# Patient Record
Sex: Male | Born: 1960 | Race: White | Hispanic: No | Marital: Married | Smoking: Never smoker
Health system: Southern US, Community
[De-identification: ages and names within clinical notes are randomized; demographics above are authoritative.]

## PROBLEM LIST (undated history)

## (undated) DIAGNOSIS — Z86711 Personal history of pulmonary embolism: Secondary | ICD-10-CM

## (undated) HISTORY — PX: HERNIA REPAIR: SHX51

## (undated) HISTORY — PX: HAND SURGERY: SHX662

---

## 2013-04-12 ENCOUNTER — Encounter (HOSPITAL_COMMUNITY): Payer: Self-pay | Admitting: Emergency Medicine

## 2013-04-12 ENCOUNTER — Emergency Department (HOSPITAL_COMMUNITY)
Admission: EM | Admit: 2013-04-12 | Discharge: 2013-04-12 | Disposition: A | Discharge status: Home / Self Care | Payer: Managed Care, Other (non HMO) | Attending: Emergency Medicine | Admitting: Emergency Medicine

## 2013-04-12 DIAGNOSIS — H538 Other visual disturbances: Secondary | ICD-10-CM

## 2013-04-12 DIAGNOSIS — Z79899 Other long term (current) drug therapy: Secondary | ICD-10-CM | POA: Insufficient documentation

## 2013-04-12 DIAGNOSIS — R062 Wheezing: Secondary | ICD-10-CM | POA: Insufficient documentation

## 2013-04-12 MED ORDER — TETRACAINE HCL 0.5 % OP SOLN
1.0000 [drp] | Freq: Once | OPHTHALMIC | Status: AC
  Administered 2013-04-12: 1 [drp] via OPHTHALMIC

## 2013-04-12 MED ORDER — FLUORESCEIN SODIUM 1 MG OP STRP
1.0000 | ORAL_STRIP | Freq: Once | OPHTHALMIC | Status: AC
  Administered 2013-04-12: 1 via OPHTHALMIC
  Filled 2013-04-12: qty 1

## 2013-04-12 NOTE — Discharge Instructions (Signed)
Please follow-up with Dr. Gwen Pounds tomorrow regarding blurry vision and floaters to eye. Please refrain from straining eye too much. Please monitor symptoms and if symptoms worsen (inflammation, pain, swelling, fever, chills) please report back to the ED.    Blurred Vision You have been seen today complaining of blurred vision. This means you have a loss of ability to see small details.  CAUSES  Blurred vision can be a symptom of underlying eye problems, such as:  Aging of the eye (presbyopia).  Glaucoma.  Cataracts.  Eye infection.  Eye-related migraine.  Diabetes mellitus.  Fatigue.  Migraine headaches.  High blood pressure.  Breakdown of the back of the eye (macular degeneration).  Problems caused by some medications. The most common cause of blurred vision is the need for eyeglasses or a new prescription. Today in the emergency department, no cause for your blurred vision can be found. SYMPTOMS  Blurred vision is the loss of visual sharpness and detail (acuity). DIAGNOSIS  Should blurred vision continue, you should see your caregiver. If your caregiver is your primary care physician, he or she may choose to refer you to another specialist.  TREATMENT  Do not ignore your blurred vision. Make sure to have it checked out to see if further treatment or referral is necessary. SEEK MEDICAL CARE IF:  You are unable to get into a specialist so we can help you with a referral. SEEK IMMEDIATE MEDICAL CARE IF: You have severe eye pain, severe headache, or sudden loss of vision. MAKE SURE YOU:   Understand these instructions.  Will watch your condition.  Will get help right away if you are not doing well or get worse. Document Released: 12/13/2003 Document Revised: 03/03/2012 Document Reviewed: 07/14/2008 [facility] Patient Information 2013 [place], [place].

## 2013-04-12 NOTE — ED Provider Notes (Signed)
History    This chart was scribed for non-physician practitioner Raymon Mutton working with Ward Givens, MD by Quintella Reichert, ED Scribe. This patient was seen in room TR04C/TR04C and the patient's care was started at 7:01 PM .   CSN: [id number]  Arrival date & time 04/12/13  1759       Chief Complaint  Patient presents with  . Blurred Vision     The history is provided by the patient. No language interpreter was used.   Alec Sharp is a 52 y.o. male who presents to the Emergency Department complaining of constant, sudden-onset visual disturbance in his right eye.  Pt states he woke up this morning with a large "floater" in the lower right quadrant of his eye, and noticed visual distortion when light hit the eye   He describes spots as "black snow," and also reports cloudiness in that eye.  Pt denies eye pain, eye discharge, ear pain, facial swelling, facial pain, cough, rhinorrhea, congestion, fever, chills, CP, or any other associated symptoms.  He denies prior h/o eye issues, but reports that his father had a torn retina 20 years ago.  Pt wears prescription eye-glasses.   History reviewed. No pertinent past medical history.  History reviewed. No pertinent past surgical history.  History reviewed. No pertinent family history.  History  Substance Use Topics  . Smoking status: Never Smoker   . Smokeless tobacco: Not on file  . Alcohol Use: Yes     Comment: occ      Review of Systems  Constitutional: Negative for fever and chills.  HENT: Negative for congestion, facial swelling and rhinorrhea.   Eyes: Positive for visual disturbance. Negative for pain and discharge.  Respiratory: Negative for cough.   Cardiovascular: Negative for chest pain.  All other systems reviewed and are negative.    Allergies  Benzoyl peroxide  Home Medications   Current Outpatient Rx  Name  Route  Sig  Dispense  Refill  . Cyanocobalamin (VITAMIN B-12) 500 MCG SUBL   Sublingual  Place 500 mcg under the tongue daily.         Marland Kitchen terbinafine (LAMISIL) 250 MG tablet   Oral   Take 250 mg by mouth daily.           BP 142/94  Pulse 69  Temp(Src) 98.3 F (36.8 C) (Oral)  Resp 16  SpO2 98%  Physical Exam  Nursing note and vitals reviewed. Constitutional: He is oriented to person, place, and time. He appears well-developed and well-nourished. No distress.  HENT:  Head: Normocephalic and atraumatic.  Mouth/Throat: Oropharynx is clear and moist. No oropharyngeal exudate.  Eyes: Pupils are equal, round, and reactive to light.  PERRL positive both eyes. Able to identify numbers when held up using right eye. Reports cloudy vision.   Neck: Normal range of motion. Neck supple. No tracheal deviation present.  Cardiovascular: Normal rate, regular rhythm and normal heart sounds.   No murmur heard. Pulmonary/Chest: Effort normal. No respiratory distress. He has wheezes. He has no rales.  Musculoskeletal: Normal range of motion. He exhibits no tenderness.  Lymphadenopathy:    He has no cervical adenopathy.  Neurological: He is alert and oriented to person, place, and time. He exhibits normal muscle tone. Coordination normal.  Skin: Skin is warm and dry. No rash noted. No erythema.  Psychiatric: He has a normal mood and affect. His behavior is normal. Thought content normal.    ED Course  Procedures (including critical care time)  DIAGNOSTIC STUDIES: Oxygen Saturation is 99% on room air, adequate by my interpretation.    COORDINATION OF CARE: 7:22 PM-Discussed treatment plan which includes slit-lamp exam with pt at bedside and pt agreed to plan.   8:15 PM: Performed SLIT LAMP EXAM: Negative Siedel's sign. No dendritic lesion noted. No corneal abrasion noted.   Labs Reviewed - No data to display No results found.   1. Blurry vision       MDM  Patient afebrile, normotensive, non-tachycardic alert and oriented. Visual acuity exam performed - without  glasses bilateral 20/70, right 20/200, left 20/70 - with glasses bilaterally 20/20, right 20/25, left 20/20.Slit lamp negative findings - negative Seidel's sign, negative dendritic lesion. Negative foreign bodies. Consulted with Dr. Gwen Pounds at 8:12 pm - discussed case with Dr. Gwen Pounds stated to discharge patient and he will see patient in office tomorrow (Monday, 04/13/2013), stated that nothing needed to be given for treatment for discharge - possible retinal detachment. Patient aseptic, non-toxic appearing, in no acute distress. Discharged patient - discussed to follow-up with Dr. Gwen Pounds. Discussed with patient - patient agreed to plan of care, understood, all questions answered.    I personally performed the services described in this documentation, which was scribed in my presence. The recorded information has been reviewed and is accurate.    Raymon Mutton, PA-C 04/13/13 0036  Raymon Mutton, PA-C 04/13/13 1605

## 2013-04-12 NOTE — ED Notes (Signed)
Patient states his father had the same symptoms when he was his age and it was a detatched retina.

## 2013-04-12 NOTE — ED Notes (Signed)
Pt sent here for possible detached retina; pt sts blurry vision with black spots in vision field in right eye only

## 2013-04-15 NOTE — ED Provider Notes (Signed)
Medical screening examination/treatment/procedure(s) were performed by non-physician practitioner and as supervising physician I was immediately available for consultation/collaboration. Devoria Albe, MD, FACEP  Pt not discussed with me  Ward Givens, MD 04/15/13 1101

## 2016-11-08 ENCOUNTER — Emergency Department (HOSPITAL_COMMUNITY)
Admission: EM | Admit: 2016-11-08 | Discharge: 2016-11-08 | Disposition: A | Discharge status: Home / Self Care | Payer: Managed Care, Other (non HMO) | Attending: Emergency Medicine | Admitting: Emergency Medicine

## 2016-11-08 ENCOUNTER — Emergency Department (HOSPITAL_COMMUNITY): Payer: Managed Care, Other (non HMO)

## 2016-11-08 DIAGNOSIS — R0789 Other chest pain: Secondary | ICD-10-CM | POA: Diagnosis not present

## 2016-11-08 DIAGNOSIS — R072 Precordial pain: Secondary | ICD-10-CM | POA: Diagnosis present

## 2016-11-08 DIAGNOSIS — Z7982 Long term (current) use of aspirin: Secondary | ICD-10-CM | POA: Diagnosis not present

## 2016-11-08 LAB — I-STAT TROPONIN, ED
TROPONIN I, POC: 0 ng/mL (ref 0.00–0.08)
Troponin i, poc: 0.01 ng/mL (ref 0.00–0.08)

## 2016-11-08 LAB — BASIC METABOLIC PANEL
Anion gap: 5 (ref 5–15)
BUN: 12 mg/dL (ref 6–20)
CALCIUM: 8.9 mg/dL (ref 8.9–10.3)
CO2: 24 mmol/L (ref 22–32)
CREATININE: 0.93 mg/dL (ref 0.61–1.24)
Chloride: 110 mmol/L (ref 101–111)
GFR calc Af Amer: >60 mL/min (ref >60)
GLUCOSE: 120 mg/dL — AB (ref 65–99)
Potassium: 4.3 mmol/L (ref 3.5–5.1)
Sodium: 139 mmol/L (ref 135–145)

## 2016-11-08 LAB — D-DIMER, QUANTITATIVE: D-Dimer, Quant: <0.27 ug/mL-FEU (ref 0.00–0.50)

## 2016-11-08 LAB — CBC
HCT: 40 % (ref 39.0–52.0)
HEMOGLOBIN: 14 g/dL (ref 13.0–17.0)
MCH: 33.4 pg (ref 26.0–34.0)
MCHC: 35 g/dL (ref 30.0–36.0)
MCV: 95.5 fL (ref 78.0–100.0)
PLATELETS: 144 10*3/uL — AB (ref 150–400)
RBC: 4.19 MIL/uL — ABNORMAL LOW (ref 4.22–5.81)
RDW: 12.7 % (ref 11.5–15.5)
WBC: 5.4 10*3/uL (ref 4.0–10.5)

## 2016-11-08 MED ORDER — GI COCKTAIL ~~LOC~~
30.0000 mL | Freq: Once | ORAL | Status: AC
  Administered 2016-11-08: 30 mL via ORAL
  Filled 2016-11-08: qty 30

## 2016-11-08 NOTE — ED Provider Notes (Signed)
[facility]-EMERGENCY DEPT Provider Note   CSN: [id number] Arrival date & time: 11/08/16  1209     History   Chief Complaint Chief Complaint  Patient presents with  . Chest Pain    HPI Alec Sharp is a 55 y.o. male.  The history is provided by the patient.  Chest Pain   This is a new problem. The current episode started yesterday. The problem occurs constantly. The problem has not changed since onset.The pain is present in the substernal region. The pain is at a severity of 1/10. The quality of the pain is described as dull. The pain does not radiate. Associated symptoms include lower extremity edema (last night after riding bike, but now resolved). Pertinent negatives include no abdominal pain, no cough, no diaphoresis, no exertional chest pressure, no fever, no irregular heartbeat, no malaise/fatigue, no nausea and no shortness of breath.  Pertinent negatives for past medical history include no CAD, no COPD, no CHF, no diabetes, no hyperlipidemia, no hypertension, no MI and no PE.  Procedure history is negative for cardiac catheterization and exercise treadmill test.   Pain started 4-54 hours after bike riding.   No past medical history on file.  There are no active problems to display for this patient.   No past surgical history on file.     Home Medications    Prior to Admission medications   Medication Sig Start Date End Date Taking? Authorizing Provider  aspirin EC 81 MG tablet Take 81 mg by mouth daily.   Yes Historical Provider, MD  Cyanocobalamin (VITAMIN B-12) 1000 MCG SUBL Place 1,000 mcg under the tongue 3 (three) times a week.    Yes Historical Provider, MD    Family History No family history on file.  Social History Social History  Substance Use Topics  . Smoking status: Never Smoker  . Smokeless tobacco: Not on file  . Alcohol use Yes     Comment: occ     Allergies   Benzoyl peroxide   Review of Systems Review of Systems  Constitutional:  Negative for diaphoresis, fever and malaise/fatigue.  Respiratory: Negative for cough and shortness of breath.   Cardiovascular: Positive for chest pain.  Gastrointestinal: Negative for abdominal pain and nausea.  Ten systems are reviewed and are negative for acute change except as noted in the HPI    Physical Exam Updated Vital Signs BP 114/74   Pulse (!) 54   Resp 17   SpO2 100%   Physical Exam  Constitutional: He is oriented to person, place, and time. He appears well-developed and well-nourished. No distress.  HENT:  Head: Normocephalic and atraumatic.  Nose: Nose normal.  Eyes: Conjunctivae and EOM are normal. Pupils are equal, round, and reactive to light. Right eye exhibits no discharge. Left eye exhibits no discharge. No scleral icterus.  Neck: Normal range of motion. Neck supple.  Cardiovascular: Normal rate and regular rhythm.  Exam reveals no gallop and no friction rub.   No murmur heard. Pulmonary/Chest: Effort normal and breath sounds normal. No stridor. No respiratory distress. He has no rales.  Abdominal: Soft. He exhibits no distension. There is no tenderness.  Musculoskeletal: He exhibits no edema or tenderness.  Neurological: He is alert and oriented to person, place, and time.  Skin: Skin is warm and dry. No rash noted. He is not diaphoretic. No erythema.  Psychiatric: He has a normal mood and affect.  Vitals reviewed.    ED Treatments / Results  Labs (all labs ordered are  listed, but only abnormal results are displayed) Labs Reviewed  BASIC METABOLIC PANEL - Abnormal; Notable for the following:       Result Value   Glucose, Bld 120 (*)    All other components within normal limits  CBC - Abnormal; Notable for the following:    RBC 4.19 (*)    Platelets 144 (*)    All other components within normal limits  D-DIMER, QUANTITATIVE (NOT AT [facility])  Rosezena SensorI-STAT TROPOININ, ED  I-STAT TROPOININ, ED    EKG  EKG Interpretation  Date/Time:  Thursday November 08 2016 12:14:37 EST Ventricular Rate:  62 PR Interval:    QRS Duration: 94 QT Interval:  410 QTC Calculation: 417 R Axis:   72 Text Interpretation:  Sinus rhythm Abnormal inferior Q waves No old tracing to compare Confirmed by [facility] MD, PEDRO [phone]([id number]) on 11/08/2016 12:58:13 PM       Radiology Dg Chest 2 View  Result Date: 11/08/2016 CLINICAL DATA:  Chest pain for 1 day EXAM: CHEST  2 VIEW COMPARISON:  None. FINDINGS: There is mild atelectatic change in the right base. The lungs elsewhere are clear. Heart size and pulmonary vascularity are normal. No adenopathy. There is atherosclerotic calcification in the aortic arch. No pneumothorax. No bone lesions. IMPRESSION: Aortic atherosclerosis. Atelectatic change right base. No edema or consolidation. Electronically Signed   By: Bretta BangWilliam  Woodruff III M.D.   On: 11/08/2016 13:18    Procedures Procedures (including critical care time)  Medications Ordered in ED Medications  gi cocktail (Maalox,Lidocaine,Donnatal) (30 mLs Oral Given 11/08/16 1255)     Initial Impression / Assessment and Plan / ED Course  I have reviewed the triage vital signs and the nursing notes.  Pertinent labs & imaging results that were available during my care of the patient were reviewed by me and considered in my medical decision making (see chart for details).  Clinical Course     Atypical chest pain ongoing for more than 12 hours. EKG with inferior Q waves however no evidence of acute ischemic changes or evidence of pericarditis. Negative chest x-ray for chest pain. HEAR < 3. 3 hour serial troponins negative 2. Feel this is sufficient to rule out ACS given the atypical and non-cardiac nature of the patient's chest pain. [facility] due to [facility]. Dimer negative. Low suspicion for pulmonary embolism. Presentation is classic for aortic dissection or esophageal perforation. Patient provided with GI cocktail. On reassessment patient's chest pain had completely  resolved. Possible GI etiology for patient's presentation.  Safe for discharge with strict return precautions.   Final Clinical Impressions(s) / ED Diagnoses   Final diagnoses:  Atypical chest pain   Disposition: Discharge  Condition: Good  I have discussed the results, Dx and Tx plan with the patient who expressed understanding and agree(s) with the plan. Discharge instructions discussed at great length. The patient was given strict return precautions who verbalized understanding of the instructions. No further questions at time of discharge.    Discharge Medication List as of 11/08/2016  4:06 PM      [facility] [address] [place] [id number] [phone] Call  For help establishing care with a care provider      Nira ConnPedro Eduardo Cardama, MD 11/08/16 [phone]

## 2016-11-08 NOTE — ED Notes (Signed)
Dr. Eudelia Bunchardama notified patient with 2 episodes of nonsustained vtach captured on monitor at 1225. Pt denies pain. VSS. Pt awake, alert, NAD at present. NNO at this time, will continue to monitor.

## 2016-11-08 NOTE — ED Triage Notes (Signed)
[facility] where pt was seen for chest pain that began last night after exercise. Pt reports being vegan on sodium restricted diet xlast 5weeks. Reports swollen legs, racing pounding in chest that lasted longer than normal and unable to sleep last night due to palpitations that never went away. Pt with 20g IV LAC, 324mg  ASA, 1 nitro with no change in pain. Rating pain 1/10 currently with deep breath. Denies recent fever/cough. VSS.

## 2016-11-14 ENCOUNTER — Other Ambulatory Visit (HOSPITAL_COMMUNITY): Payer: Self-pay | Admitting: Radiology

## 2016-11-14 ENCOUNTER — Encounter (HOSPITAL_COMMUNITY): Payer: Self-pay

## 2016-11-14 DIAGNOSIS — R001 Bradycardia, unspecified: Secondary | ICD-10-CM | POA: Diagnosis not present

## 2016-11-14 DIAGNOSIS — I824Z2 Acute embolism and thrombosis of unspecified deep veins of left distal lower extremity: Secondary | ICD-10-CM | POA: Diagnosis not present

## 2016-11-14 DIAGNOSIS — Z79899 Other long term (current) drug therapy: Secondary | ICD-10-CM | POA: Insufficient documentation

## 2016-11-14 DIAGNOSIS — I2699 Other pulmonary embolism without acute cor pulmonale: Secondary | ICD-10-CM | POA: Diagnosis not present

## 2016-11-14 DIAGNOSIS — Z8249 Family history of ischemic heart disease and other diseases of the circulatory system: Secondary | ICD-10-CM | POA: Insufficient documentation

## 2016-11-14 DIAGNOSIS — Z7982 Long term (current) use of aspirin: Secondary | ICD-10-CM | POA: Diagnosis not present

## 2016-11-14 DIAGNOSIS — R079 Chest pain, unspecified: Secondary | ICD-10-CM | POA: Diagnosis present

## 2016-11-14 NOTE — ED Triage Notes (Signed)
Pt states that central CP started this evening with radiation to neck and SOB, denies n/v.

## 2016-11-15 ENCOUNTER — Observation Stay (HOSPITAL_BASED_OUTPATIENT_CLINIC_OR_DEPARTMENT_OTHER): Payer: Managed Care, Other (non HMO)

## 2016-11-15 ENCOUNTER — Observation Stay (HOSPITAL_COMMUNITY)
Admission: EM | Admit: 2016-11-15 | Discharge: 2016-11-16 | Disposition: A | Discharge status: Home / Self Care | Payer: Managed Care, Other (non HMO) | Attending: Internal Medicine | Admitting: Internal Medicine

## 2016-11-15 ENCOUNTER — Emergency Department (HOSPITAL_COMMUNITY): Payer: Managed Care, Other (non HMO)

## 2016-11-15 DIAGNOSIS — R001 Bradycardia, unspecified: Secondary | ICD-10-CM | POA: Diagnosis not present

## 2016-11-15 DIAGNOSIS — R079 Chest pain, unspecified: Secondary | ICD-10-CM | POA: Diagnosis present

## 2016-11-15 DIAGNOSIS — I2699 Other pulmonary embolism without acute cor pulmonale: Secondary | ICD-10-CM | POA: Diagnosis present

## 2016-11-15 LAB — HEPATIC FUNCTION PANEL
ALK PHOS: 69 U/L (ref 38–126)
ALT: 15 U/L — AB (ref 17–63)
AST: 24 U/L (ref 15–41)
Albumin: 4.6 g/dL (ref 3.5–5.0)
BILIRUBIN DIRECT: 0.2 mg/dL (ref 0.1–0.5)
BILIRUBIN INDIRECT: 0.7 mg/dL (ref 0.3–0.9)
BILIRUBIN TOTAL: 0.9 mg/dL (ref 0.3–1.2)
Total Protein: 7.2 g/dL (ref 6.5–8.1)

## 2016-11-15 LAB — BASIC METABOLIC PANEL
Anion gap: 9 (ref 5–15)
BUN: 13 mg/dL (ref 6–20)
CALCIUM: 9.4 mg/dL (ref 8.9–10.3)
CO2: 24 mmol/L (ref 22–32)
CREATININE: 1.03 mg/dL (ref 0.61–1.24)
Chloride: 106 mmol/L (ref 101–111)
GFR calc Af Amer: >60 mL/min (ref >60)
GFR calc non Af Amer: >60 mL/min (ref >60)
GLUCOSE: 113 mg/dL — AB (ref 65–99)
Potassium: 3.6 mmol/L (ref 3.5–5.1)
Sodium: 139 mmol/L (ref 135–145)

## 2016-11-15 LAB — CBC
HEMATOCRIT: 42.5 % (ref 39.0–52.0)
HEMOGLOBIN: 15.3 g/dL (ref 13.0–17.0)
MCH: 33.9 pg (ref 26.0–34.0)
MCHC: 36 g/dL (ref 30.0–36.0)
MCV: 94.2 fL (ref 78.0–100.0)
Platelets: 158 10*3/uL (ref 150–400)
RBC: 4.51 MIL/uL (ref 4.22–5.81)
RDW: 12.2 % (ref 11.5–15.5)
WBC: 5.6 10*3/uL (ref 4.0–10.5)

## 2016-11-15 LAB — TROPONIN I: Troponin I: <0.03 ng/mL (ref <0.03)

## 2016-11-15 LAB — I-STAT TROPONIN, ED: Troponin i, poc: 0 ng/mL (ref 0.00–0.08)

## 2016-11-15 LAB — HEPARIN LEVEL (UNFRACTIONATED)
HEPARIN UNFRACTIONATED: 0.49 [IU]/mL (ref 0.30–0.70)
Heparin Unfractionated: 0.5 IU/mL (ref 0.30–0.70)

## 2016-11-15 LAB — LIPASE, BLOOD: Lipase: 31 U/L (ref 11–51)

## 2016-11-15 LAB — BRAIN NATRIURETIC PEPTIDE: B Natriuretic Peptide: 62.5 pg/mL (ref 0.0–100.0)

## 2016-11-15 LAB — D-DIMER, QUANTITATIVE: D-Dimer, Quant: 0.57 ug/mL-FEU — ABNORMAL HIGH (ref 0.00–0.50)

## 2016-11-15 MED ORDER — PANTOPRAZOLE SODIUM 40 MG PO TBEC
40.0000 mg | DELAYED_RELEASE_TABLET | Freq: Every day | ORAL | Status: DC
  Administered 2016-11-15 – 2016-11-16 (×2): 40 mg via ORAL
  Filled 2016-11-15 (×2): qty 1

## 2016-11-15 MED ORDER — GI COCKTAIL ~~LOC~~
30.0000 mL | Freq: Once | ORAL | Status: DC
  Filled 2016-11-15: qty 30

## 2016-11-15 MED ORDER — ASPIRIN 81 MG PO CHEW: 324.0000 mg | CHEWABLE_TABLET | Freq: Once | ORAL | Status: DC

## 2016-11-15 MED ORDER — IOPAMIDOL (ISOVUE-370) INJECTION 76%
INTRAVENOUS | Status: AC
  Administered 2016-11-15: 100 mL
  Filled 2016-11-15: qty 100

## 2016-11-15 MED ORDER — HEPARIN (PORCINE) IN NACL 100-0.45 UNIT/ML-% IJ SOLN
1300.0000 [IU]/h | INTRAMUSCULAR | Status: DC
  Administered 2016-11-15 (×2): 1300 [IU]/h via INTRAVENOUS
  Filled 2016-11-15 (×2): qty 250

## 2016-11-15 MED ORDER — ONDANSETRON HCL 4 MG/2ML IJ SOLN: 4.0000 mg | Freq: Four times a day (QID) | INTRAMUSCULAR | Status: DC | PRN

## 2016-11-15 MED ORDER — ASPIRIN EC 81 MG PO TBEC
81.0000 mg | DELAYED_RELEASE_TABLET | Freq: Every day | ORAL | Status: DC
  Administered 2016-11-15: 81 mg via ORAL
  Filled 2016-11-15 (×2): qty 1

## 2016-11-15 MED ORDER — NITROGLYCERIN 0.4 MG SL SUBL: 0.4000 mg | SUBLINGUAL_TABLET | SUBLINGUAL | Status: DC | PRN

## 2016-11-15 MED ORDER — ONDANSETRON HCL 4 MG PO TABS: 4.0000 mg | ORAL_TABLET | Freq: Four times a day (QID) | ORAL | Status: DC | PRN

## 2016-11-15 MED ORDER — TRAMADOL HCL 50 MG PO TABS: 50.0000 mg | ORAL_TABLET | Freq: Four times a day (QID) | ORAL | Status: DC | PRN

## 2016-11-15 MED ORDER — HEPARIN BOLUS VIA INFUSION
5000.0000 [IU] | Freq: Once | INTRAVENOUS | Status: AC
  Administered 2016-11-15: 5000 [IU] via INTRAVENOUS
  Filled 2016-11-15: qty 5000

## 2016-11-15 NOTE — Progress Notes (Signed)
ANTICOAGULATION CONSULT NOTE - Follow Up Consult  Pharmacy Consult:  Heparin Indication:  New PE  Allergies  Allergen Reactions  . Benzoyl Peroxide Rash and Other (See Comments)    Cream    Patient Measurements: Height: 5\' 10"  (177.8 cm) Weight: 197 lb 4.8 oz (89.5 kg) IBW/kg (Calculated) : 73 Heparin Dosing Weight: 90 kg  Vital Signs: Temp: 97.7 F (36.5 C) (11/23 1114) Temp Source: Oral (11/23 1114) BP: 134/92 (11/23 1114) Pulse Rate: 53 (11/23 0550)  Labs:  Recent Labs  11/14/16 2342 11/15/16 0505 11/15/16 1012  HGB 15.3  --   --   HCT 42.5  --   --   PLT 158  --   --   HEPARINUNFRC  --   --  0.49  CREATININE 1.03  --   --   TROPONINI  --  <0.03  --     Estimated Creatinine Clearance: 91.2 mL/min (by C-G formula based on SCr of 1.03 mg/dL).     Assessment: [age] YOM with no significant medical history admitted with new PE.  Patient continues on IV heparin and heparin level is therapeutic.  No bleeding reported.   Goal of Therapy:  Heparin level 0.3-0.7 units/ml Monitor platelets by anticoagulation protocol: Yes    Plan:  - Continue heparin gtt at 1300 units/hr - Check confirmatory heparin level - Daily heparin level and CBC - F/U with transitioning [facility]   Taneasha Fuqua D. Laney Potashang, PharmD, BCPS Pager:  [phone] 11/15/2016, 11:28 AM

## 2016-11-15 NOTE — ED Notes (Signed)
Pt refused GI cocktail. Said he had last week and it had no affect.

## 2016-11-15 NOTE — ED Notes (Signed)
Pt back from CT

## 2016-11-15 NOTE — ED Notes (Signed)
EDP at bedside  

## 2016-11-15 NOTE — ED Notes (Signed)
Patient transported to CT 

## 2016-11-15 NOTE — ED Notes (Signed)
Report called and given to RN on 2W

## 2016-11-15 NOTE — Progress Notes (Signed)
ANTICOAGULATION CONSULT NOTE - Follow Up Consult  Pharmacy Consult:  Heparin Indication:  New PE  Allergies  Allergen Reactions  . Benzoyl Peroxide Rash and Other (See Comments)    Cream    Patient Measurements: Height: 5\' 10"  (177.8 cm) Weight: 197 lb 4.8 oz (89.5 kg) IBW/kg (Calculated) : 73 Heparin Dosing Weight: 90 kg  Vital Signs: Temp: 97.9 F (36.6 C) (11/23 1351) Temp Source: Oral (11/23 1351) BP: 136/85 (11/23 1351) Pulse Rate: 58 (11/23 1351)  Labs:  Recent Labs  11/14/16 2342 11/15/16 0505 11/15/16 1012 11/15/16 1540 11/15/16 1541  HGB 15.3  --   --   --   --   HCT 42.5  --   --   --   --   PLT 158  --   --   --   --   HEPARINUNFRC  --   --  0.49  --  0.50  CREATININE 1.03  --   --   --   --   TROPONINI  --  <0.03 <0.03 <0.03  --     Estimated Creatinine Clearance: 91.2 mL/min (by C-G formula based on SCr of 1.03 mg/dL).   Medications: Heparin @ 1300 units/hr  Assessment: 55yom continues on heparin for a new PE. Confirmatory heparin level is therapeutic at 0.50. No bleeding reported.  Goal of Therapy:  Heparin level 0.3-0.7 units/ml Monitor platelets by anticoagulation protocol: Yes   Plan:  1) Continue heparin at 1300 units/hr 2) Daily heparin level and CBC   Louie CasaJennifer Odeth Bry, PharmD, BCPS 11/15/2016, 4:46 PM

## 2016-11-15 NOTE — ED Provider Notes (Signed)
[facility]-EMERGENCY DEPT Provider Note   CSN: [id number] Arrival date & time: 11/14/16  2330  By signing my name below, I, Clovis PuAvnee Patel, attest that this documentation has been prepared under the direction and in the presence of Glynn OctaveStephen Wm Fruchter, MD  Electronically Signed: Clovis PuAvnee Patel, ED Scribe. 11/15/16. 1:54 AM.  History   Chief Complaint Chief Complaint  Patient presents with  . Chest Pain   The history is provided by the patient. No language interpreter was used.   HPI Comments:  Alec Sharp is a 55 y.o. male who presents to the Emergency Department complaining of intermittent, recurring diffuse "1/10 dull" chest pain and chest tightness x 1 week which worsened today. He notes his pain lasts for 15-20 minutes. Pt states the pain radiates to his neck and is worse with breathing. He notes associated nausea. Pt states his "heart has not been feeling right" intermittently during the week.  He has taken 4-81 mg baby aspirin and 2 nitroglycerin treatments, at PCP office, with relief. Pt denies hx of heart problems, hx of asthma, hx of COPD, hx of ulcers, cough, back pain, current neck pain, fevers, light-headedness, any other associated symptoms and modifying factors at this time.Pt was here on 11/08/16 with similar symptoms which began after using a stationary bike. Pt states he has never had stress test or catheterization. He is an occasional alcohol user. Pt is a non-smoker. He notes a family hx of heart problems.   History reviewed. No pertinent past medical history.  There are no active problems to display for this patient.   History reviewed. No pertinent surgical history.   Home Medications    Prior to Admission medications   Medication Sig Start Date End Date Taking? Authorizing Provider  aspirin EC 81 MG tablet Take 81 mg by mouth daily.    Historical Provider, MD  Cyanocobalamin (VITAMIN B-12) 1000 MCG SUBL Place 1,000 mcg under the tongue 3 (three) times a week.      Historical Provider, MD    Family History No family history on file.  Social History Social History  Substance Use Topics  . Smoking status: Never Smoker  . Smokeless tobacco: Never Used  . Alcohol use Yes     Comment: occ     Allergies   Benzoyl peroxide   Review of Systems Review of Systems  Constitutional: Negative for fever.  Respiratory: Positive for chest tightness. Negative for cough.   Cardiovascular: Positive for chest pain.  Gastrointestinal: Positive for nausea.  Musculoskeletal: Negative for back pain and neck pain.  Neurological: Negative for light-headedness.  10 systems reviewed and all are negative for acute change except as noted in the HPI.  Physical Exam Updated Vital Signs BP (!) 146/109   Pulse 64   Temp 98.2 F (36.8 C)   Resp 20   Ht 5\' 10"  (1.778 m)   Wt 194 lb (88 kg)   SpO2 98%   BMI 27.84 kg/m   Physical Exam  Constitutional: He is oriented to person, place, and time. He appears well-developed and well-nourished. No distress.  HENT:  Head: Normocephalic and atraumatic.  Mouth/Throat: Oropharynx is clear and moist. No oropharyngeal exudate.  Eyes: Conjunctivae and EOM are normal. Pupils are equal, round, and reactive to light.  Neck: Normal range of motion. Neck supple.  No meningismus.  Cardiovascular: Normal rate, regular rhythm, normal heart sounds and intact distal pulses.   No murmur heard. Equal peripheral pulses.   Pulmonary/Chest: Effort normal and breath sounds normal. No  respiratory distress.  Abdominal: Soft. There is no tenderness. There is no rebound and no guarding.  Musculoskeletal: Normal range of motion. He exhibits no edema or tenderness.  Neurological: He is alert and oriented to person, place, and time. No cranial nerve deficit. He exhibits normal muscle tone. Coordination normal.   5/5 strength throughout. CN 2-12 intact.Equal grip strength.   Skin: Skin is warm.  Psychiatric: He has a normal mood and affect.  His behavior is normal.  Nursing note and vitals reviewed.    ED Treatments / Results  DIAGNOSTIC STUDIES:  Oxygen Saturation is 98% on RA, normal by my interpretation.    COORDINATION OF CARE:  1:47 AM Discussed treatment plan with pt at bedside and pt agreed to plan.  Labs (all labs ordered are listed, but only abnormal results are displayed) Labs Reviewed  BASIC METABOLIC PANEL - Abnormal; Notable for the following:       Result Value   Glucose, Bld 113 (*)    All other components within normal limits  HEPATIC FUNCTION PANEL - Abnormal; Notable for the following:    ALT 15 (*)    All other components within normal limits  D-DIMER, QUANTITATIVE (NOT AT [facility]) - Abnormal; Notable for the following:    D-Dimer, Quant 0.57 (*)    All other components within normal limits  CBC  LIPASE, BLOOD  BRAIN NATRIURETIC PEPTIDE  TROPONIN I  HEPARIN LEVEL (UNFRACTIONATED)  TROPONIN I  TROPONIN I  I-STAT TROPOININ, ED    EKG  EKG Interpretation  Date/Time:  Thursday November 15 2016 01:36:23 EST Ventricular Rate:  52 PR Interval:    QRS Duration: 96 QT Interval:  428 QTC Calculation: 398 R Axis:   78 Text Interpretation:  Sinus rhythm Inferior Q waves noted No significant change was found Confirmed by Manus Gunning  MD, Cherrish Vitali [phone]) on 11/15/2016 1:49:45 AM       Radiology Dg Chest 2 View  Result Date: 11/15/2016 CLINICAL DATA:  Acute onset of generalized chest pain and weakness. Initial encounter. EXAM: CHEST  2 VIEW COMPARISON:  Chest radiograph performed 11/08/2016 FINDINGS: The lungs are well-aerated. Vascular congestion is noted. Bibasilar atelectasis is noted. No pleural effusion or pneumothorax is seen. The heart is normal in size; the mediastinal contour is within normal limits. No acute osseous abnormalities are seen. IMPRESSION: Vascular congestion noted.  Bibasilar atelectasis seen. Electronically Signed   By: Roanna Raider M.D.   On: 11/15/2016 00:38   Ct Angio  Chest Pe W And/or Wo Contrast  Result Date: 11/15/2016 CLINICAL DATA:  55 y/o  M; chest pain and shortness of breath. EXAM: CT ANGIOGRAPHY CHEST WITH CONTRAST TECHNIQUE: Multidetector CT imaging of the chest was performed using the standard protocol during bolus administration of intravenous contrast. Multiplanar CT image reconstructions and MIPs were obtained to evaluate the vascular anatomy. CONTRAST:  100 cc Isovue 370 COMPARISON:  None. FINDINGS: Cardiovascular: Satisfactory opacification of the pulmonary arteries to the segmental level. Respiratory motion artifact at lung bases. Acute pulmonary embolus within segmental arteries of right upper lobe (series 406, image 74). No evidence for right heart strain. Mild cardiomegaly. No pericardial effusion. Aortic atherosclerosis with minimal arch calcification. Mediastinum/Nodes: No enlarged mediastinal, hilar, or axillary lymph nodes. Thyroid gland, trachea, and esophagus demonstrate no significant findings. Lungs/Pleura: Mild mosaic attenuation of lung parenchyma. Right lung apex 4 mm nodule (series 407, image 27). Minor bibasilar atelectasis. No acute process. No effusion or pneumothorax. Mild respiratory motion artifact of the lung bases. Upper Abdomen:  No acute abnormality. Musculoskeletal: No chest wall abnormality. No acute or significant osseous findings. Review of the MIP images confirms the above findings. IMPRESSION: 1. Acute pulmonary embolus within segmental arteries of right upper lobe. 2. Mild mosaic attenuation of lung parenchyma may represent small airways disease and air trapping or small vessel disease. 3. Mild cardiomegaly. These results were called by telephone at the time of interpretation on 11/15/2016 at 3:59 am to Dr. Glynn OctaveSTEPHEN Beaux [place] , who verbally acknowledged these results. Electronically Signed   By: Mitzi HansenLance  Furusawa-Stratton M.D.   On: 11/15/2016 04:00    Procedures Procedures (including critical care time)  Medications Ordered in  ED Medications - No data to display   Initial Impression / Assessment and Plan / ED Course  I have reviewed the triage vital signs and the nursing notes.  Pertinent labs & imaging results that were available during my care of the patient were reviewed by me and considered in my medical decision making (see chart for details).  Clinical Course    Patient with progressively worsening central chest pain ongoing for the past week. Similar visit for chest pain last week. EKG is unchanged. Troponin is negative. Pain is pleuritic.  No relief with aspirin and nitroglycerin. We'll discussed with cardiology. Patient has never had a stress test.  D/w Dr. Gala RomneyBensimhon who requests CT coronary which is not available at this time of night.  He recommends hospitalist admission.  D-dimer slightly elevated. CT does show segmental pulmonary embolism. We'll start heparin. Patient will be medically admitted. There is no hypoxia or increased work of breathing. There is no tachycardia.  D.w Dr. Katrinka BlazingSmith who will admit.  CRITICAL CARE Performed by: Glynn OctaveANCOUR, Dondra Rhett Total critical care time: 35 minutes Critical care time was exclusive of separately billable procedures and treating other patients. Critical care was necessary to treat or prevent imminent or life-threatening deterioration. Critical care was time spent personally by me on the following activities: development of treatment plan with patient and/or surrogate as well as nursing, discussions with consultants, evaluation of patient's response to treatment, examination of patient, obtaining history from patient or surrogate, ordering and performing treatments and interventions, ordering and review of laboratory studies, ordering and review of radiographic studies, pulse oximetry and re-evaluation of patient's condition.    Final Clinical Impressions(s) / ED Diagnoses   Final diagnoses:  Chest pain  Nonspecific chest pain  Other acute pulmonary embolism  without acute cor pulmonale (HCC)     New Prescriptions New Prescriptions   No medications on file  I personally performed the services described in this documentation, which was scribed in my presence. The recorded information has been reviewed and is accurate.     Glynn OctaveStephen Lynee Rosenbach, MD 11/15/16 [phone]

## 2016-11-15 NOTE — Progress Notes (Signed)
VASCULAR LAB PRELIMINARY  PRELIMINARY  PRELIMINARY  PRELIMINARY  Bilateral lower extremity venous duplex completed.    Preliminary report:  There is acute, partially occlusive DVT noted in the left peroneal vein.  All other veins appear thrombus free.   Stana Bayon, RVT 11/15/2016, 7:20 PM

## 2016-11-15 NOTE — Progress Notes (Signed)
PROGRESS NOTE    Alec Sharp  ZOX:[id number] DOB: 11-Feb-1961 DOA: 11/15/2016 PCP: Pcp Not In System    Brief Narrative:  55 yo male, presents with pleuritic chest pain, intermittent, for 7 days. Has been very sedentary over the last 3 mo. Positive family history for dvt. On physical examination found to be bradycardic. Positive d dimer, CT angiography with right upper lobe PE. Started on heparin, follow on [place] lower extremities and echocardiogram.    Assessment & Plan:   Principal Problem:   Pulmonary embolus (HCC) Active Problems:   Chest pain   Bradycardia   1. Provoked VTE pulmonary embolism. Will continue on IV heparin for anticoagulation, follow on echoardiogram and US dopplers of the lower extremities. Negative troponins. EKG with normal sinus rhythm.  2. Bradycardia. Sinus, possible increased vagal tone. Blood pressure systolic 120 to 130.     DVT prophylaxis: heparin Code Status: full  Family Communication: No family at the bedside Disposition Plan: home    Consultants:    Procedures:     Antimicrobials:     Subjective: Improve chest pain, worse with deep inspiration, no dyspnea, no palpitations, no nausea or vomiting.   Objective: Vitals:   11/15/16 0500 11/15/16 0549 11/15/16 0550 11/15/16 1114  BP: 143/92  122/82 (!) 134/92  Pulse: (!) 50  (!) 53   Resp: 10   18  Temp:   98.3 F (36.8 C) 97.7 F (36.5 C)  TempSrc:   Oral Oral  SpO2: 98%  100% 97%  Weight:  89.5 kg (197 lb 4.8 oz)    Height:  5\' 10"  (1.778 m)      Intake/Output Summary (Last 24 hours) at 11/15/16 1236 Last data filed at 11/15/16 0800  Gross per 24 hour  Intake              360 ml  Output                0 ml  Net              360 ml   Filed Weights   11/14/16 2341 11/15/16 0549  Weight: 88 kg (194 lb) 89.5 kg (197 lb 4.8 oz)    Examination:  General exam: not in pain or dyspnea E ENT: no pallor or icterus. Oral mucosa moist.   Respiratory system: Clear to  auscultation. Respiratory effort normal. No wheezing, rales or rhonchi.  Cardiovascular system: S1 & S2 heard, RRR. No JVD, murmurs, rubs, gallops or clicks. No pedal edema. Gastrointestinal system: Abdomen is nondistended, soft and nontender. No organomegaly or masses felt. Normal bowel sounds heard. Central nervous system: Alert and oriented. No focal neurological deficits. Extremities: Symmetric 5 x 5 power. Skin: No rashes, lesions or ulcers     Data Reviewed: I have personally reviewed following labs and imaging studies  CBC:  Recent Labs Lab 11/08/16 1330 11/14/16 2342  WBC 5.4 5.6  HGB 14.0 15.3  HCT 40.0 42.5  MCV 95.5 94.2  PLT 144* 158   Basic Metabolic Panel:  Recent Labs Lab 11/08/16 1330 11/14/16 2342  NA 139 139  K 4.3 3.6  CL 110 106  CO2 24 24  GLUCOSE 120* 113*  BUN 12 13  CREATININE 0.93 1.03  CALCIUM 8.9 9.4   GFR: Estimated Creatinine Clearance: 91.2 mL/min (by C-G formula based on SCr of 1.03 mg/dL). Liver Function Tests:  Recent Labs Lab 11/15/16 0144  AST 24  ALT 15*  ALKPHOS 69  BILITOT 0.9  PROT  7.2  ALBUMIN 4.6    Recent Labs Lab 11/15/16 0144  LIPASE 31   No results for input(s): AMMONIA in the last 168 hours. Coagulation Profile: No results for input(s): INR, PROTIME in the last 168 hours. Cardiac Enzymes:  Recent Labs Lab 11/15/16 0505 11/15/16 1012  TROPONINI <0.03 <0.03   BNP (last 3 results) No results for input(s): PROBNP in the last 8760 hours. HbA1C: No results for input(s): HGBA1C in the last 72 hours. CBG: No results for input(s): GLUCAP in the last 168 hours. Lipid Profile: No results for input(s): CHOL, HDL, LDLCALC, TRIG, CHOLHDL, LDLDIRECT in the last 72 hours. Thyroid Function Tests: No results for input(s): TSH, T4TOTAL, FREET4, T3FREE, THYROIDAB in the last 72 hours. Anemia Panel: No results for input(s): VITAMINB12, FOLATE, FERRITIN, TIBC, IRON, RETICCTPCT in the last 72 hours. Sepsis  Labs: No results for input(s): PROCALCITON, LATICACIDVEN in the last 168 hours.  No results found for this or any previous visit (from the past 240 hour(s)).       Radiology Studies: Dg Chest 2 View  Result Date: 11/15/2016 CLINICAL DATA:  Acute onset of generalized chest pain and weakness. Initial encounter. EXAM: CHEST  2 VIEW COMPARISON:  Chest radiograph performed 11/08/2016 FINDINGS: The lungs are well-aerated. Vascular congestion is noted. Bibasilar atelectasis is noted. No pleural effusion or pneumothorax is seen. The heart is normal in size; the mediastinal contour is within normal limits. No acute osseous abnormalities are seen. IMPRESSION: Vascular congestion noted.  Bibasilar atelectasis seen. Electronically Signed   By: Roanna RaiderJeffery  Chang M.D.   On: 11/15/2016 00:38   Ct Angio Chest Pe W And/or Wo Contrast  Result Date: 11/15/2016 CLINICAL DATA:  55 y/o  M; chest pain and shortness of breath. EXAM: CT ANGIOGRAPHY CHEST WITH CONTRAST TECHNIQUE: Multidetector CT imaging of the chest was performed using the standard protocol during bolus administration of intravenous contrast. Multiplanar CT image reconstructions and MIPs were obtained to evaluate the vascular anatomy. CONTRAST:  100 cc Isovue 370 COMPARISON:  None. FINDINGS: Cardiovascular: Satisfactory opacification of the pulmonary arteries to the segmental level. Respiratory motion artifact at lung bases. Acute pulmonary embolus within segmental arteries of right upper lobe (series 406, image 74). No evidence for right heart strain. Mild cardiomegaly. No pericardial effusion. Aortic atherosclerosis with minimal arch calcification. Mediastinum/Nodes: No enlarged mediastinal, hilar, or axillary lymph nodes. Thyroid gland, trachea, and esophagus demonstrate no significant findings. Lungs/Pleura: Mild mosaic attenuation of lung parenchyma. Right lung apex 4 mm nodule (series 407, image 27). Minor bibasilar atelectasis. No acute process. No  effusion or pneumothorax. Mild respiratory motion artifact of the lung bases. Upper Abdomen: No acute abnormality. Musculoskeletal: No chest wall abnormality. No acute or significant osseous findings. Review of the MIP images confirms the above findings. IMPRESSION: 1. Acute pulmonary embolus within segmental arteries of right upper lobe. 2. Mild mosaic attenuation of lung parenchyma may represent small airways disease and air trapping or small vessel disease. 3. Mild cardiomegaly. These results were called by telephone at the time of interpretation on 11/15/2016 at 3:59 am to Dr. Glynn OctaveSTEPHEN RANCOUR , who verbally acknowledged these results. Electronically Signed   By: Mitzi HansenLance  Furusawa-Stratton M.D.   On: 11/15/2016 04:00        Scheduled Meds: . aspirin EC  81 mg Oral Daily  . gi cocktail  30 mL Oral Once  . pantoprazole  40 mg Oral Daily   Continuous Infusions: . heparin 1,300 Units/hr (11/15/16 0434)     LOS: 0 days  Alec Massiah Annett Gulaaniel Jakaiya Netherland, MD [facility] Pager [phone]  If 7PM-7AM, please contact night-coverage [email or web address] Password [email or web address] 11/15/2016, 12:36 PM

## 2016-11-15 NOTE — Progress Notes (Signed)
ANTICOAGULATION CONSULT NOTE - Initial Consult  Pharmacy Consult for heparin Indication: pulmonary embolus  Allergies  Allergen Reactions  . Benzoyl Peroxide Rash and Other (See Comments)    Cream    Patient Measurements: Height: 5\' 10"  (177.8 cm) Weight: 194 lb (88 kg) IBW/kg (Calculated) : 73  Vital Signs: Temp: 98.2 F (36.8 C) (11/22 2341) BP: 122/94 (11/23 0315) Pulse Rate: 58 (11/23 0315)  Labs:  Recent Labs  11/14/16 2342  HGB 15.3  HCT 42.5  PLT 158  CREATININE 1.03    Estimated Creatinine Clearance: 90.5 mL/min (by C-G formula based on SCr of 1.03 mg/dL).   Medical History: History reviewed. No pertinent past medical history.   Assessment: 55yo male c/o central CP radiating to neck and associated w/ SOB, CT reveals PE, to begin heparin.  Goal of Therapy:  Heparin level 0.3-0.7 units/ml Monitor platelets by anticoagulation protocol: Yes   Plan:  Will give heparin 5000 units IV bolus x1 followed by gtt at 1300 units/hr and monitor heparin levels and CBC; f/u long-term anticoag plans.  Vernard GamblesVeronda Nyxon Strupp, PharmD, BCPS  11/15/2016,4:04 AM

## 2016-11-15 NOTE — H&P (Signed)
History and Physical    Alec MontaneJoseph Wisz JYN:[id number] DOB: 12/02/1961 DOA: 11/15/2016  Referring MD/NP/PA: Dr. Manus Gunningancour PCP: Pcp Not In System  Patient coming from: Home  Chief Complaint: Chest pain   HPI: Alec MontaneJoseph Sharp is a 55 y.o. male without significant past medical history; who presents with complaints of intermittent chest pain. Symptoms initially started approximately 1 week ago after the patient had been riding on his stationary bike at home. He reports a dull right sided chest pain and tightness. Pain lasts anywhere from 15-20 minutes and would radiate into his neck. Symptoms made worse with taking a deep inspiratory breath. Associated symptoms included some nausea. Patient has no significant cardiac history. 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 He was evaluated last week in the emergency department but was later discharged home. Patient states that his father had a history of blood clots in his 7880s for which he is on blood thinners. Denies any other known family history of clotting disorders.  ED Course: Upon admission to the emergency department patient was seen to be afebrile, pulse 46-64, and all other vitals relatively within normal limits. Lab work was unremarkable except for elevated d-dimer of 0.57.  CT angiogram was obtained which showed right upper lobe embolus. Patient was started on a heparin drip per pharmacy. Cardiology had been consulted prior to CT angiogram for possible need of stress testing.TRH called to admit  Review of Systems: As per HPI otherwise 10 point review of systems negative.   History reviewed. No pertinent past medical history.  History reviewed. No pertinent surgical history.   reports that he has never smoked. He has never used smokeless tobacco. He reports that he drinks alcohol. He reports that he  does not use drugs.  Allergies  Allergen Reactions  . Benzoyl Peroxide Rash and Other (See Comments)    Cream    No family history on file.  Prior to Admission medications   Medication Sig Start Date End Date Taking? Authorizing Provider  aspirin EC 81 MG tablet Take 81 mg by mouth daily.    Historical Provider, MD  Cyanocobalamin (VITAMIN B-12) 1000 MCG SUBL Place 1,000 mcg under the tongue 3 (three) times a week.     Historical Provider, MD    Physical Exam:   Constitutional: NAD, calm, comfortable Vitals:   11/15/16 0135 11/15/16 0137 11/15/16 0200 11/15/16 0300  BP: 151/97  126/93 124/93  Pulse: (!) 56  (!) 52 (!) 46  Resp:   11 12  Temp:      SpO2: 99% 98% 98% 98%  Weight:      Height:       Eyes: PERRL, lids and conjunctivae normal ENMT: Mucous membranes are moist. Posterior pharynx clear of any exudate or lesions.Normal dentition.  Neck: normal, supple, no masses, no thyromegaly Respiratory: clear to auscultation bilaterally, no wheezing, no crackles. Normal respiratory effort. No accessory muscle use.  Cardiovascular: Bradycardic. no murmurs / rubs / gallops. No extremity edema. 2+ pedal pulses. No carotid bruits.  Abdomen: no tenderness, no masses palpated. No hepatosplenomegaly. Bowel sounds positive.  Musculoskeletal: no clubbing / cyanosis. No joint deformity upper and lower extremities. Good ROM, no contractures. Normal muscle tone.  Skin: no rashes, lesions, ulcers. No induration Neurologic: CN 2-12 grossly intact. Sensation intact, DTR normal. Strength 5/5  in all 4.  Psychiatric: Normal judgment and insight. Alert and oriented x 3. Normal mood.     Labs on Admission: I have personally reviewed following labs and imaging studies  CBC:  Recent Labs Lab 11/08/16 1330 11/14/16 2342  WBC 5.4 5.6  HGB 14.0 15.3  HCT 40.0 42.5  MCV 95.5 94.2  PLT 144* 158   Basic Metabolic Panel:  Recent Labs Lab 11/08/16 1330 11/14/16 2342  NA 139 139  K 4.3  3.6  CL 110 106  CO2 24 24  GLUCOSE 120* 113*  BUN 12 13  CREATININE 0.93 1.03  CALCIUM 8.9 9.4   GFR: Estimated Creatinine Clearance: 90.5 mL/min (by C-G formula based on SCr of 1.03 mg/dL). Liver Function Tests:  Recent Labs Lab 11/15/16 0144  AST 24  ALT 15*  ALKPHOS 69  BILITOT 0.9  PROT 7.2  ALBUMIN 4.6    Recent Labs Lab 11/15/16 0144  LIPASE 31   No results for input(s): AMMONIA in the last 168 hours. Coagulation Profile: No results for input(s): INR, PROTIME in the last 168 hours. Cardiac Enzymes: No results for input(s): CKTOTAL, CKMB, CKMBINDEX, TROPONINI in the last 168 hours. BNP (last 3 results) No results for input(s): PROBNP in the last 8760 hours. HbA1C: No results for input(s): HGBA1C in the last 72 hours. CBG: No results for input(s): GLUCAP in the last 168 hours. Lipid Profile: No results for input(s): CHOL, HDL, LDLCALC, TRIG, CHOLHDL, LDLDIRECT in the last 72 hours. Thyroid Function Tests: No results for input(s): TSH, T4TOTAL, FREET4, T3FREE, THYROIDAB in the last 72 hours. Anemia Panel: No results for input(s): VITAMINB12, FOLATE, FERRITIN, TIBC, IRON, RETICCTPCT in the last 72 hours. Urine analysis: No results found for: COLORURINE, APPEARANCEUR, LABSPEC, PHURINE, GLUCOSEU, HGBUR, BILIRUBINUR, KETONESUR, PROTEINUR, UROBILINOGEN, NITRITE, LEUKOCYTESUR Sepsis Labs: No results found for this or any previous visit (from the past 240 hour(s)).   Radiological Exams on Admission: Dg Chest 2 View  Result Date: 11/15/2016 CLINICAL DATA:  Acute onset of generalized chest pain and weakness. Initial encounter. EXAM: CHEST  2 VIEW COMPARISON:  Chest radiograph performed 11/08/2016 FINDINGS: The lungs are well-aerated. Vascular congestion is noted. Bibasilar atelectasis is noted. No pleural effusion or pneumothorax is seen. The heart is normal in size; the mediastinal contour is within normal limits. No acute osseous abnormalities are seen. IMPRESSION:  Vascular congestion noted.  Bibasilar atelectasis seen. Electronically Signed   By: Roanna RaiderJeffery  Chang M.D.   On: 11/15/2016 00:38    EKG: Independently reviewed. Sinus bradycardia  Assessment/Plan Pulmonary embolus/chest pain: Acute. Patient reports intermittent chest pain worse with deep inspiration mostly located on the right-hand side. Troponins negative. D-dimer positive. CT angiogram reveals right upper lobe embolus that appears possibly provoked from patient's prolonged sedentary periods working at the computer over the last few months. - Admit to a telemetry bed - Heparin per pharmacy - Check echocardiogram and lower extremity Doppler ultrasound. - Trend cardiac troponins - Will need to transition off of heparin to anticoagulation agent   Asymptomatic bradycardia - Continue to monitor   DVT prophylaxis: Heparin  Code Status: Full Family Communication: no Family present at bedside  Disposition Plan: Likely discharge home once medically stable  Consults called:  Cards Admission status: Observation to telemetry  Clydie Braunondell A Smith MD [facility] Pager [phone]  If 7PM-7AM, please contact night-coverage [email or web address] Password [email or web address]  11/15/2016, 3:36 AM

## 2016-11-15 NOTE — ED Notes (Signed)
Attempted to call report x 1. Will try again in 10 min.

## 2016-11-16 ENCOUNTER — Observation Stay (HOSPITAL_BASED_OUTPATIENT_CLINIC_OR_DEPARTMENT_OTHER): Payer: Managed Care, Other (non HMO)

## 2016-11-16 ENCOUNTER — Other Ambulatory Visit (HOSPITAL_COMMUNITY): Payer: Managed Care, Other (non HMO)

## 2016-11-16 DIAGNOSIS — I2699 Other pulmonary embolism without acute cor pulmonale: Secondary | ICD-10-CM

## 2016-11-16 DIAGNOSIS — R001 Bradycardia, unspecified: Secondary | ICD-10-CM | POA: Diagnosis not present

## 2016-11-16 DIAGNOSIS — R079 Chest pain, unspecified: Secondary | ICD-10-CM | POA: Diagnosis not present

## 2016-11-16 LAB — ECHOCARDIOGRAM COMPLETE
Height: 70 in
WEIGHTICAEL: 3156.8 [oz_av]

## 2016-11-16 LAB — CBC
HEMATOCRIT: 41.9 % (ref 39.0–52.0)
HEMOGLOBIN: 14.8 g/dL (ref 13.0–17.0)
MCH: 33.7 pg (ref 26.0–34.0)
MCHC: 35.3 g/dL (ref 30.0–36.0)
MCV: 95.4 fL (ref 78.0–100.0)
Platelets: 129 10*3/uL — ABNORMAL LOW (ref 150–400)
RBC: 4.39 MIL/uL (ref 4.22–5.81)
RDW: 12.7 % (ref 11.5–15.5)
WBC: 4.7 10*3/uL (ref 4.0–10.5)

## 2016-11-16 LAB — HEPARIN LEVEL (UNFRACTIONATED): HEPARIN UNFRACTIONATED: 0.55 [IU]/mL (ref 0.30–0.70)

## 2016-11-16 MED ORDER — RIVAROXABAN (XARELTO) VTE STARTER PACK (15 & 20 MG): ORAL_TABLET | ORAL | 0 refills | Status: DC

## 2016-11-16 MED ORDER — RIVAROXABAN 15 MG PO TABS
15.0000 mg | ORAL_TABLET | Freq: Two times a day (BID) | ORAL | Status: DC
  Administered 2016-11-16 (×2): 15 mg via ORAL
  Filled 2016-11-16 (×2): qty 1

## 2016-11-16 MED ORDER — RIVAROXABAN 20 MG PO TABS: 20.0000 mg | ORAL_TABLET | Freq: Every day | ORAL | Status: DC

## 2016-11-16 NOTE — Progress Notes (Signed)
Patient in a stable condition, this RN went over discharge education with patient and wife at bedside, they verbalised understanding, paper prescription given to patient, tele dc ccmd notified, iv removed, patient taken off th e unit on a wheelchair by a NT

## 2016-11-16 NOTE — Progress Notes (Signed)
Physician Discharge Summary  Alec MontaneJoseph Sharp RUE:[id number] DOB: 07/07/1961 DOA: 11/15/2016  PCP: Pcp Not In System  Admit date: 11/15/2016 Discharge date: 11/16/2016  Admitted From:  Home  Disposition:  Home   Recommendations for Outpatient Follow-up:  1. Follow up with PCP in 1- week 2. Patient has been started on rivaroxaban 3. Aspirin has been discontinued   Home Health: No  Equipment/Devices: no    Discharge Condition: Stable   CODE STATUS: Full  Diet recommendation: regular.   Brief/Interim Summary: This a 55 yo male who presented to the hospital with the chief complain of chest pain. Pain was precordial, mainly pleuritic in nature, for last week prior to hospitalization and duration, intermittent in nature. Lasting for about 15-20 minutes, at a time with occasional radiation to the neck. Over last 3 months patient has been sedentary. Positive family history for deep vein thrombosis. On initial physical examination he was found bradycardic and 46 a 64 bpm, blood pressure 126/93, respiratory rate 12, oxygenation 98%. He was awake and alert, moist mucous membranes, lungs were clear to auscultation bilaterally, no wheezing, rales or rhonchi. Heart S1-S2 present rhythmic, lower extremities no edema. Sodium 139, potassium 3.6, BUN 13, creatinine 1.03, glucose 113, white count 4.7, hemoglobin 14.8, hematocrit 41.9, platelets 129. D-dimer 0.57. Electrocardiogram normal sinus rhythm. Chest film negative for infiltrates. CT chest with acute pulmonary embolism within segmental arteries of the right upper lobe.   The patient was admitted to the hospital with working diagnosis of acute pulmonary embolism complicated by sinus bradycardia.   1. Provoked pulmonary embolism and left lower extremity deep vein thrombosis. Patient was admitted to the telemetry unit, he was placed on IV heparin, he remained hemodynamically stable. Echocardiogram showed normal LV and RV systolic function. Ultrasonography  of the lower extremities preliminary report showed acute partially occlusive DVT on the left peroneal vein. He was transitioned to rivaroxaban with no major complications. It is presumed that venous thromboembolism is provoked due to sedentary lifestyle, he may have a genetic predisposition considering patient's family history of venous thromboembolism. Oximetry 96% on room air at the time of discharge  2. Sinus bradycardia. Patient remained hemodynamically stable his heart rate ranged between 50 and 60 probably related to increased vagal tone. Echocardiogram with normal LV function. Aspirin will be discontinued.     Discharge Diagnoses:  Principal Problem:   Pulmonary embolus (HCC) Active Problems:   Chest pain   Bradycardia    Discharge Instructions     Medication List    STOP taking these medications   aspirin EC 81 MG tablet     TAKE these medications   Rivaroxaban 15 & 20 MG Tbpk Take as directed on package: Start with one 15mg  tablet by mouth twice a day with food. On Day 22, switch to one 20mg  tablet once a day with food.   Vitamin B-12 1000 MCG Subl Place 1,000 mcg under the tongue 3 (three) times a week.      Follow-up Information    Primary Care Follow up in 1 week(s).          Allergies  Allergen Reactions  . Benzoyl Peroxide Rash and Other (See Comments)    Cream    Consultations:     Procedures/Studies: Dg Chest 2 View  Result Date: 11/15/2016 CLINICAL DATA:  Acute onset of generalized chest pain and weakness. Initial encounter. EXAM: CHEST  2 VIEW COMPARISON:  Chest radiograph performed 11/08/2016 FINDINGS: The lungs are well-aerated. Vascular congestion is noted. Bibasilar atelectasis  is noted. No pleural effusion or pneumothorax is seen. The heart is normal in size; the mediastinal contour is within normal limits. No acute osseous abnormalities are seen. IMPRESSION: Vascular congestion noted.  Bibasilar atelectasis seen. Electronically Signed    By: Roanna RaiderJeffery  Chang M.D.   On: 11/15/2016 00:38   Dg Chest 2 View  Result Date: 11/08/2016 CLINICAL DATA:  Chest pain for 1 day EXAM: CHEST  2 VIEW COMPARISON:  None. FINDINGS: There is mild atelectatic change in the right base. The lungs elsewhere are clear. Heart size and pulmonary vascularity are normal. No adenopathy. There is atherosclerotic calcification in the aortic arch. No pneumothorax. No bone lesions. IMPRESSION: Aortic atherosclerosis. Atelectatic change right base. No edema or consolidation. Electronically Signed   By: Bretta BangWilliam  Woodruff III M.D.   On: 11/08/2016 13:18   Ct Angio Chest Pe W And/or Wo Contrast  Result Date: 11/15/2016 CLINICAL DATA:  55 y/o  M; chest pain and shortness of breath. EXAM: CT ANGIOGRAPHY CHEST WITH CONTRAST TECHNIQUE: Multidetector CT imaging of the chest was performed using the standard protocol during bolus administration of intravenous contrast. Multiplanar CT image reconstructions and MIPs were obtained to evaluate the vascular anatomy. CONTRAST:  100 cc Isovue 370 COMPARISON:  None. FINDINGS: Cardiovascular: Satisfactory opacification of the pulmonary arteries to the segmental level. Respiratory motion artifact at lung bases. Acute pulmonary embolus within segmental arteries of right upper lobe (series 406, image 74). No evidence for right heart strain. Mild cardiomegaly. No pericardial effusion. Aortic atherosclerosis with minimal arch calcification. Mediastinum/Nodes: No enlarged mediastinal, hilar, or axillary lymph nodes. Thyroid gland, trachea, and esophagus demonstrate no significant findings. Lungs/Pleura: Mild mosaic attenuation of lung parenchyma. Right lung apex 4 mm nodule (series 407, image 27). Minor bibasilar atelectasis. No acute process. No effusion or pneumothorax. Mild respiratory motion artifact of the lung bases. Upper Abdomen: No acute abnormality. Musculoskeletal: No chest wall abnormality. No acute or significant osseous findings. Review  of the MIP images confirms the above findings. IMPRESSION: 1. Acute pulmonary embolus within segmental arteries of right upper lobe. 2. Mild mosaic attenuation of lung parenchyma may represent small airways disease and air trapping or small vessel disease. 3. Mild cardiomegaly. These results were called by telephone at the time of interpretation on 11/15/2016 at 3:59 am to Dr. Glynn OctaveSTEPHEN RANCOUR , who verbally acknowledged these results. Electronically Signed   By: Mitzi HansenLance  Furusawa-Stratton M.D.   On: 11/15/2016 04:00       Subjective: Patient feeling well, mild pleuritic chest pain, no nausea or vomiting, no dyspnea.   Discharge Exam: Vitals:   11/16/16 0443 11/16/16 0958  BP: 133/88 123/76  Pulse: (!) 56 64  Resp: 18 18  Temp: 98 F (36.7 C) 98.6 F (37 C)   Vitals:   11/15/16 1351 11/15/16 2100 11/16/16 0443 11/16/16 0958  BP: 136/85 131/83 133/88 123/76  Pulse: (!) 58 (!) 55 (!) 56 64  Resp: 19 18 18 18   Temp: 97.9 F (36.6 C) 97.8 F (36.6 C) 98 F (36.7 C) 98.6 F (37 C)  TempSrc: Oral Oral  Oral  SpO2: 95% 96% 98% 96%  Weight:      Height:        General: Pt is alert, awake, not in acute distress Cardiovascular: RRR, S1/S2 +, no rubs, no gallops Respiratory: CTA bilaterally, no wheezing, no rhonchi Abdominal: Soft, NT, ND, bowel sounds + Extremities: no edema, no cyanosis    The results of significant diagnostics from this hospitalization (including imaging, microbiology, ancillary  and laboratory) are listed below for reference.     Microbiology: No results found for this or any previous visit (from the past 240 hour(s)).   Labs: BNP (last 3 results)  Recent Labs  11/15/16 0144  BNP 62.5   Basic Metabolic Panel:  Recent Labs Lab 11/14/16 2342  NA 139  K 3.6  CL 106  CO2 24  GLUCOSE 113*  BUN 13  CREATININE 1.03  CALCIUM 9.4   Liver Function Tests:  Recent Labs Lab 11/15/16 0144  AST 24  ALT 15*  ALKPHOS 69  BILITOT 0.9  PROT 7.2   ALBUMIN 4.6    Recent Labs Lab 11/15/16 0144  LIPASE 31   No results for input(s): AMMONIA in the last 168 hours. CBC:  Recent Labs Lab 11/14/16 2342 11/16/16 0434  WBC 5.6 4.7  HGB 15.3 14.8  HCT 42.5 41.9  MCV 94.2 95.4  PLT 158 129*   Cardiac Enzymes:  Recent Labs Lab 11/15/16 0505 11/15/16 1012 11/15/16 1540  TROPONINI <0.03 <0.03 <0.03   BNP: Invalid input(s): POCBNP CBG: No results for input(s): GLUCAP in the last 168 hours. D-Dimer  Recent Labs  11/15/16 0144  DDIMER 0.57*   Hgb A1c No results for input(s): HGBA1C in the last 72 hours. Lipid Profile No results for input(s): CHOL, HDL, LDLCALC, TRIG, CHOLHDL, LDLDIRECT in the last 72 hours. Thyroid function studies No results for input(s): TSH, T4TOTAL, T3FREE, THYROIDAB in the last 72 hours.  Invalid input(s): FREET3 Anemia work up No results for input(s): VITAMINB12, FOLATE, FERRITIN, TIBC, IRON, RETICCTPCT in the last 72 hours. Urinalysis No results found for: COLORURINE, APPEARANCEUR, LABSPEC, PHURINE, GLUCOSEU, HGBUR, BILIRUBINUR, KETONESUR, PROTEINUR, UROBILINOGEN, NITRITE, LEUKOCYTESUR Sepsis Labs Invalid input(s): PROCALCITONIN,  WBC,  LACTICIDVEN Microbiology No results found for this or any previous visit (from the past 240 hour(s)).   Time coordinating discharge: 45 minutes  SIGNED:   Coralie Keens, MD  [facility] 11/16/2016, 1:30 PM Pager   If 7PM-7AM, please contact night-coverage [email or web address] Password [email or web address]

## 2016-11-16 NOTE — Progress Notes (Signed)
ANTICOAGULATION CONSULT NOTE - Follow Up Consult  Pharmacy Consult:  Heparin>> xarelto  Indication:  New PE/DVT  Allergies  Allergen Reactions  . Benzoyl Peroxide Rash and Other (See Comments)    Cream    Patient Measurements: Height: 5\' 10"  (177.8 cm) Weight: 197 lb 4.8 oz (89.5 kg) IBW/kg (Calculated) : 73 Heparin Dosing Weight: 90 kg  Vital Signs: Temp: 98 F (36.7 C) (11/24 0443) Temp Source: Oral (11/23 2100) BP: 133/88 (11/24 0443) Pulse Rate: 56 (11/24 0443)  Labs:  Recent Labs  11/14/16 2342 11/15/16 0505 11/15/16 1012 11/15/16 1540 11/15/16 1541 11/16/16 0434  HGB 15.3  --   --   --   --  14.8  HCT 42.5  --   --   --   --  41.9  PLT 158  --   --   --   --  129*  HEPARINUNFRC  --   --  0.49  --  0.50 0.55  CREATININE 1.03  --   --   --   --   --   TROPONINI  --  <0.03 <0.03 <0.03  --   --     Estimated Creatinine Clearance: 91.2 mL/min (by C-G formula based on SCr of 1.03 mg/dL).   Medications: Heparin @ 1300 units/hr  Assessment: 55yom continues on heparin for a new PE/DVT. Pharmacy consulted to change to xarelto  Goal of Therapy:  Heparin level 0.3-0.7 units/ml Monitor platelets by anticoagulation protocol: Yes   Plan:  -xarelto 15mg  po bid for 21 days followed by 20mg  po daily -Will provide patient education  Harland Germanndrew Mitchell Epling, Pharm D 11/16/2016 8:03 AM   e

## 2016-11-16 NOTE — Discharge Summary (Signed)
Alec Sharp ZOX:[id number] DOB: 03/01/1961 DOA: 11/15/2016  PCP: Pcp Not In System  Admit date: 11/15/2016 Discharge date: 11/16/2016  Admitted From:  Home  Disposition:  Home   Recommendations for Outpatient Follow-up:  1. Follow up with PCP in 1- week 2. Patient has been started on rivaroxaban 3. Aspirin has been discontinued   Home Health: No  Equipment/Devices: no    Discharge Condition: Stable   CODE STATUS: Full  Diet recommendation: regular.   Brief/Interim Summary: This a 55 yo male who presented to the hospital with the chief complain of chest pain. Pain was precordial, mainly pleuritic in nature, for last week prior to hospitalization and duration, intermittent in nature. Lasting for about 15-20 minutes, at a time with occasional radiation to the neck. Over last 3 months patient has been sedentary. Positive family history for deep vein thrombosis. On initial physical examination he was found bradycardic and 46 a 64 bpm, blood pressure 126/93, respiratory rate 12, oxygenation 98%. He was awake and alert, moist mucous membranes, lungs were clear to auscultation bilaterally, no wheezing, rales or rhonchi. Heart S1-S2 present rhythmic, lower extremities no edema. Sodium 139, potassium 3.6, BUN 13, creatinine 1.03, glucose 113, white count 4.7, hemoglobin 14.8, hematocrit 41.9, platelets 129. D-dimer 0.57. Electrocardiogram normal sinus rhythm. Chest film negative for infiltrates. CT chest with acute pulmonary embolism within segmental arteries of the right upper lobe.   The patient was admitted to the hospital with working diagnosis of acute pulmonary embolism complicated by sinus bradycardia.   1. Provoked pulmonary embolism and left lower extremity deep vein thrombosis. Patient was admitted to the telemetry unit, he was placed on IV heparin, he remained hemodynamically stable. Echocardiogram showed normal LV and RV systolic function. Ultrasonography of the lower  extremities preliminary report showed acute partially occlusive DVT on the left peroneal vein. He was transitioned to rivaroxaban with no major complications. It is presumed that venous thromboembolism is provoked due to sedentary lifestyle, he may have a genetic predisposition considering patient's family history of venous thromboembolism. Oximetry 96% on room air at the time of discharge  2. Sinus bradycardia. Patient remained hemodynamically stable his heart rate ranged between 50 and 60 probably related to increased vagal tone. Echocardiogram with normal LV function. Aspirin will be discontinued.   Discharge Diagnoses:  Principal Problem:   Pulmonary embolus (HCC) Active Problems:   Chest pain   Bradycardia  Discharge Instructions     Medication List    STOP taking these medications   aspirin EC 81 MG tablet    TAKE these medications   Rivaroxaban 15 & 20 MG Tbpk Take as directed on package: Start with one 15mg  tablet by mouth twice a day with food. On Day 22, switch to one 20mg  tablet once a day with food.  Vitamin B-12 1000 MCG Subl Place 1,000 mcg under the tongue 3 (three) times a week.        Follow-up Information    Primary Care Follow up in 1 week(s).               Allergies  Allergen Reactions  . Benzoyl Peroxide Rash and Other (See Comments)    Cream    Consultations:     Procedures/Studies: Imaging Results   Dg Chest 2 View  Result Date: 11/15/2016 CLINICAL DATA:  Acute onset of generalized chest pain and weakness. Initial encounter. EXAM: CHEST  2 VIEW COMPARISON:  Chest radiograph performed 11/08/2016 FINDINGS: The lungs are well-aerated. Vascular congestion is noted. Bibasilar  atelectasis is noted. No pleural effusion or pneumothorax is seen. The heart is normal in size; the mediastinal contour is within normal limits. No acute osseous abnormalities are seen. IMPRESSION: Vascular congestion noted.  Bibasilar atelectasis  seen. Electronically Signed   By: Roanna Raider M.D.   On: 11/15/2016 00:38   Dg Chest 2 View  Result Date: 11/08/2016 CLINICAL DATA:  Chest pain for 1 day EXAM: CHEST  2 VIEW COMPARISON:  None. FINDINGS: There is mild atelectatic change in the right base. The lungs elsewhere are clear. Heart size and pulmonary vascularity are normal. No adenopathy. There is atherosclerotic calcification in the aortic arch. No pneumothorax. No bone lesions. IMPRESSION: Aortic atherosclerosis. Atelectatic change right base. No edema or consolidation. Electronically Signed   By: Bretta Bang III M.D.   On: 11/08/2016 13:18   Ct Angio Chest Pe W And/or Wo Contrast  Result Date: 11/15/2016 CLINICAL DATA:  55 y/o  M; chest pain and shortness of breath. EXAM: CT ANGIOGRAPHY CHEST WITH CONTRAST TECHNIQUE: Multidetector CT imaging of the chest was performed using the standard protocol during bolus administration of intravenous contrast. Multiplanar CT image reconstructions and MIPs were obtained to evaluate the vascular anatomy. CONTRAST:  100 cc Isovue 370 COMPARISON:  None. FINDINGS: Cardiovascular: Satisfactory opacification of the pulmonary arteries to the segmental level. Respiratory motion artifact at lung bases. Acute pulmonary embolus within segmental arteries of right upper lobe (series 406, image 74). No evidence for right heart strain. Mild cardiomegaly. No pericardial effusion. Aortic atherosclerosis with minimal arch calcification. Mediastinum/Nodes: No enlarged mediastinal, hilar, or axillary lymph nodes. Thyroid gland, trachea, and esophagus demonstrate no significant findings. Lungs/Pleura: Mild mosaic attenuation of lung parenchyma. Right lung apex 4 mm nodule (series 407, image 27). Minor bibasilar atelectasis. No acute process. No effusion or pneumothorax. Mild respiratory motion artifact of the lung bases. Upper Abdomen: No acute abnormality. Musculoskeletal: No chest wall abnormality. No acute or  significant osseous findings. Review of the MIP images confirms the above findings. IMPRESSION: 1. Acute pulmonary embolus within segmental arteries of right upper lobe. 2. Mild mosaic attenuation of lung parenchyma may represent small airways disease and air trapping or small vessel disease. 3. Mild cardiomegaly. These results were called by telephone at the time of interpretation on 11/15/2016 at 3:59 am to Dr. Glynn Octave , who verbally acknowledged these results. Electronically Signed   By: Mitzi Hansen M.D.   On: 11/15/2016 04:00        Subjective: Patient feeling well, mild pleuritic chest pain, no nausea or vomiting, no dyspnea.   Discharge Exam:     Vitals:   11/16/16 0443 11/16/16 0958  BP: 133/88 123/76  Pulse: (!) 56 64  Resp: 18 18  Temp: 98 F (36.7 C) 98.6 F (37 C)         Vitals:   11/15/16 1351 11/15/16 2100 11/16/16 0443 11/16/16 0958  BP: 136/85 131/83 133/88 123/76  Pulse: (!) 58 (!) 55 (!) 56 64  Resp: 19 18 18 18   Temp: 97.9 F (36.6 C) 97.8 F (36.6 C) 98 F (36.7 C) 98.6 F (37 C)  TempSrc: Oral Oral  Oral  SpO2: 95% 96% 98% 96%  Weight:      Height:        General: Pt is alert, awake, not in acute distress Cardiovascular: RRR, S1/S2 +, no rubs, no gallops Respiratory: CTA bilaterally, no wheezing, no rhonchi Abdominal: Soft, NT, ND, bowel sounds + Extremities: no edema, no cyanosis  The results of significant diagnostics from this hospitalization (including imaging, microbiology, ancillary and laboratory) are listed below for reference.     Microbiology: No results found for this or any previous visit (from the past 240 hour(s)).   Labs: BNP (last 3 results)  Recent Labs (within last 365 days)   Recent Labs  11/15/16 0144  BNP 62.5     Basic Metabolic Panel:  Last Labs    Recent Labs Lab 11/14/16 2342  NA 139  K 3.6  CL 106  CO2 24  GLUCOSE 113*  BUN 13  CREATININE  1.03  CALCIUM 9.4     Liver Function Tests:  Last Labs    Recent Labs Lab 11/15/16 0144  AST 24  ALT 15*  ALKPHOS 69  BILITOT 0.9  PROT 7.2  ALBUMIN 4.6      Last Labs    Recent Labs Lab 11/15/16 0144  LIPASE 31     Last Labs   No results for input(s): AMMONIA in the last 168 hours.   CBC:  Last Labs    Recent Labs Lab 11/14/16 2342 11/16/16 0434  WBC 5.6 4.7  HGB 15.3 14.8  HCT 42.5 41.9  MCV 94.2 95.4  PLT 158 129*     Cardiac Enzymes:  Last Labs    Recent Labs Lab 11/15/16 0505 11/15/16 1012 11/15/16 1540  TROPONINI <0.03 <0.03 <0.03     BNP: Last Labs   Invalid input(s): POCBNP   CBG: Last Labs   No results for input(s): GLUCAP in the last 168 hours.   D-Dimer  Recent Labs (last 2 labs)    Recent Labs  11/15/16 0144  DDIMER 0.57*     Hgb A1c Recent Labs (last 2 labs)   No results for input(s): HGBA1C in the last 72 hours.   Lipid Profile Recent Labs (last 2 labs)   No results for input(s): CHOL, HDL, LDLCALC, TRIG, CHOLHDL, LDLDIRECT in the last 72 hours.   Thyroid function studies  Recent Labs (last 2 labs)   No results for input(s): TSH, T4TOTAL, T3FREE, THYROIDAB in the last 72 hours.  Invalid input(s): FREET3   Anemia work up [employer] Labs (last 2 labs)   No results for input(s): VITAMINB12, FOLATE, FERRITIN, TIBC, IRON, RETICCTPCT in the last 72 hours.   Urinalysis Labs (Brief)  No results found for: COLORURINE, APPEARANCEUR, LABSPEC, PHURINE, GLUCOSEU, HGBUR, BILIRUBINUR, KETONESUR, PROTEINUR, UROBILINOGEN, NITRITE, LEUKOCYTESUR   Sepsis Labs Last Labs   Invalid input(s): PROCALCITONIN,  WBC,  LACTICIDVEN   Microbiology No results found for this or any previous visit (from the past 240 hour(s)).   Time coordinating discharge: 45 minutes  SIGNED:   Coralie KeensMauricio Daniel Birdella Sippel, MD      [facility] 11/16/2016, 1:30 PM Pager   If 7PM-7AM, please contact  night-coverage [email or web address] Password [email or web address]

## 2016-11-16 NOTE — Care Management Note (Signed)
Case Management Note  Patient Details  Name: Alec Sharp MRN: [id number] Date of Birth: 06/11/1961  Subjective/Objective:   Pt admitted for CP                 Action/Plan:  PT from home with wife independent.    Pt has strong family support.  Pt confirmed he doesn't have medicaid or medicare -- CM provided both free 30 day card and copay card for Xarelto.  Pt uses [facility] on [address] and [address] - CM contacted pharmacy and pharamcy is able to fill medication.CM will continue to follow for discharge needs   Expected Discharge Date:                  Expected Discharge Plan:  Home/Self Care  In-House Referral:     Discharge planning Services  CM Consult  Post Acute Care Choice:    Choice offered to:     DME Arranged:    DME Agency:     HH Arranged:    HH Agency:     Status of Service:  In process, will continue to follow  If discussed at Long Length of Stay Meetings, dates discussed:    Additional Comments:  Alec Sharp, Alec Peine S, RN 11/16/2016, 11:14 AM

## 2016-11-16 NOTE — Progress Notes (Signed)
2D echocardiogram has been performed. 

## 2016-11-27 ENCOUNTER — Ambulatory Visit: Payer: Managed Care, Other (non HMO) | Admitting: Interventional Cardiology

## 2016-11-30 ENCOUNTER — Encounter: Payer: Self-pay | Admitting: *Deleted

## 2016-11-30 ENCOUNTER — Other Ambulatory Visit: Payer: Self-pay | Admitting: *Deleted

## 2016-11-30 NOTE — Patient Outreach (Signed)
[facility] Care Management  11/30/2016  Alec MontaneJoseph Sharp 06/29/1961 [id number]   Subjective: Telephone call to patient's home number, spoke with patient, and HIPAA verified.   [facility]ed [facility] [employer] Transition of care follow up, patient voiced understanding, and is in agreement to complete the follow up. Patient states he is doing fine, recovering from recent hospitalization, and has had all of his questions answered by primary MD (Dr. Carilyn Goodpastureibas Koirala)  during  11/26/16 office visit.  Patient states she does not have any transition of care, care coordination, disease management, disease monitoring, transportation, community resource, or pharmacy needs at this time.   States he is self employed and health insurance is through wife.   States he is very appreciative of the follow up call and is in agreement to receive [facility] information.  Objective: Per chart review: Patient hospitalized 11/15/16 - 11/16/16 for bradycardia, Provoked pulmonary embolism and left lower extremity deep vein thrombosis.     Assessment:  [facility] [employer] Transition [facility] care [facility] 12/[facility]/1.     Referral source: Annitta NeedsKim Lampel.    Transition of care completed, no care management needs, and will proceed with case closure.   Plan: RNCM will send patient successful outreach letter, [facility] pamphlet, and magnet. RNCM will send case status update, case closure due to follow up completed / no care management needs request to Iverson AlaminLaura Greeson at [facility].   Everlene Cunning H. Gardiner Barefootooper RN, BSN, CCM [facility] Care [facility] Telephonic CM Phone: [phone] Fax: [phone]

## 2017-01-24 ENCOUNTER — Encounter (HOSPITAL_COMMUNITY): Payer: Self-pay

## 2017-01-24 ENCOUNTER — Other Ambulatory Visit: Payer: Self-pay

## 2017-01-24 ENCOUNTER — Emergency Department (HOSPITAL_COMMUNITY): Payer: 59

## 2017-01-24 ENCOUNTER — Emergency Department (HOSPITAL_COMMUNITY)
Admission: EM | Admit: 2017-01-24 | Discharge: 2017-01-24 | Disposition: A | Discharge status: Home / Self Care | Payer: 59 | Attending: Emergency Medicine | Admitting: Emergency Medicine

## 2017-01-24 DIAGNOSIS — R0781 Pleurodynia: Secondary | ICD-10-CM | POA: Insufficient documentation

## 2017-01-24 DIAGNOSIS — Z7901 Long term (current) use of anticoagulants: Secondary | ICD-10-CM | POA: Insufficient documentation

## 2017-01-24 DIAGNOSIS — R079 Chest pain, unspecified: Secondary | ICD-10-CM

## 2017-01-24 HISTORY — DX: Personal history of pulmonary embolism: Z86.711

## 2017-01-24 LAB — CBC
HEMATOCRIT: 43.7 % (ref 39.0–52.0)
Hemoglobin: 15.4 g/dL (ref 13.0–17.0)
MCH: 33.3 pg (ref 26.0–34.0)
MCHC: 35.2 g/dL (ref 30.0–36.0)
MCV: 94.4 fL (ref 78.0–100.0)
PLATELETS: 141 10*3/uL — AB (ref 150–400)
RBC: 4.63 MIL/uL (ref 4.22–5.81)
RDW: 12.3 % (ref 11.5–15.5)
WBC: 5.1 10*3/uL (ref 4.0–10.5)

## 2017-01-24 LAB — I-STAT TROPONIN, ED: Troponin i, poc: 0 ng/mL (ref 0.00–0.08)

## 2017-01-24 LAB — BASIC METABOLIC PANEL
Anion gap: 9 (ref 5–15)
BUN: 13 mg/dL (ref 6–20)
CHLORIDE: 107 mmol/L (ref 101–111)
CO2: 24 mmol/L (ref 22–32)
CREATININE: 1.02 mg/dL (ref 0.61–1.24)
Calcium: 9.5 mg/dL (ref 8.9–10.3)
GFR calc Af Amer: >60 mL/min (ref >60)
GFR calc non Af Amer: >60 mL/min (ref >60)
GLUCOSE: 127 mg/dL — AB (ref 65–99)
Potassium: 4.1 mmol/L (ref 3.5–5.1)
SODIUM: 140 mmol/L (ref 135–145)

## 2017-01-24 LAB — PROTIME-INR
INR: 1.95
Prothrombin Time: 22.5 seconds — ABNORMAL HIGH (ref 11.4–15.2)

## 2017-01-24 MED ORDER — KETOROLAC TROMETHAMINE 30 MG/ML IJ SOLN
30.0000 mg | Freq: Once | INTRAMUSCULAR | Status: AC
  Administered 2017-01-24: 30 mg via INTRAVENOUS
  Filled 2017-01-24: qty 1

## 2017-01-24 MED ORDER — IOPAMIDOL (ISOVUE-370) INJECTION 76%
INTRAVENOUS | Status: AC
  Administered 2017-01-24: 100 mL
  Filled 2017-01-24: qty 100

## 2017-01-24 NOTE — ED Notes (Signed)
Pt leaving for CTA

## 2017-01-24 NOTE — ED Notes (Signed)
Declined W/C at D/C and was escorted to lobby by RN. 

## 2017-01-24 NOTE — ED Triage Notes (Signed)
Patient here with intermittent chest pain for the past few days, had pulmonary embolism in November and takes xarelto daily for same. No SOB, pain worse with movment

## 2017-01-24 NOTE — ED Provider Notes (Signed)
[facility]-EMERGENCY DEPT Provider Note   CSN: [id number] Arrival date & time: 01/24/17  1214     History   Chief Complaint Chief Complaint  Patient presents with  . Chest Pain    HPI Alec Sharp is a 56 y.o. male.  HPI Patient presents the emergency department intermittent pleuritic chest pain over the past several days.  He was diagnosed with a pulmonary embolism in November 2017.  He has no shortness of breath at this time and his pain seems to be worse with movement.  He has no unilateral leg pain.  No fevers or chills.  He is on Xarelto and is compliant with this.  No fevers or chills.      Past Medical History:  Diagnosis Date  . History of pulmonary embolus (PE)     Patient Active Problem List   Diagnosis Date Noted  . Chest pain 11/15/2016  . Pulmonary embolus (HCC) 11/15/2016  . Bradycardia 11/15/2016    History reviewed. No pertinent surgical history.     Home Medications    Prior to Admission medications   Medication Sig Start Date End Date Taking? Authorizing Provider  Cyanocobalamin (VITAMIN B-12) 1000 MCG SUBL Place 1,000 mcg under the tongue 3 (three) times a week.    Yes Historical Provider, MD  Rivaroxaban 15 & 20 MG TBPK Take as directed on package: Start with one 15mg  tablet by mouth twice a day with food. On Day 22, switch to one 20mg  tablet once a day with food. Patient taking differently: Take 20 mg by mouth daily. Take as directed on package: Start with one 15mg  tablet by mouth twice a day with food. On Day 22, switch to one 20mg  tablet once a day with food. 11/16/16  Yes Mauricio Annett Gula, MD    Family History No family history on file.  Social History Social History  Substance Use Topics  . Smoking status: Never Smoker  . Smokeless tobacco: Never Used  . Alcohol use Yes     Comment: occ     Allergies   Benzoyl peroxide   Review of Systems Review of Systems  All other systems reviewed and are negative.    Physical  Exam Updated Vital Signs BP (!) 145/101 (BP Location: Right Arm)   Pulse 83   Temp 98.8 F (37.1 C) (Oral)   Resp 18   SpO2 97%   Physical Exam  Constitutional: He is oriented to person, place, and time. He appears well-developed and well-nourished.  HENT:  Head: Normocephalic and atraumatic.  Eyes: EOM are normal.  Neck: Normal range of motion.  Cardiovascular: Normal rate, regular rhythm and normal heart sounds.   Pulmonary/Chest: Effort normal and breath sounds normal. No respiratory distress.  Abdominal: Soft. He exhibits no distension. There is no tenderness.  Musculoskeletal: Normal range of motion.  Neurological: He is alert and oriented to person, place, and time.  Skin: Skin is warm and dry.  Psychiatric: He has a normal mood and affect. Judgment normal.  Nursing note and vitals reviewed.    ED Treatments / Results  Labs (all labs ordered are listed, but only abnormal results are displayed) Labs Reviewed  BASIC METABOLIC PANEL - Abnormal; Notable for the following:       Result Value   Glucose, Bld 127 (*)    All other components within normal limits  CBC - Abnormal; Notable for the following:    Platelets 141 (*)    All other components within normal limits  PROTIME-INR -  Abnormal; Notable for the following:    Prothrombin Time 22.5 (*)    All other components within normal limits  I-STAT TROPOININ, ED    EKG  EKG Interpretation  Date/Time:  Thursday January 24 2017 12:20:53 EST Ventricular Rate:  77 PR Interval:  152 QRS Duration: 92 QT Interval:  382 QTC Calculation: 432 R Axis:   104 Text Interpretation:  Normal sinus rhythm Possible Lateral infarct , age undetermined Possible Inferior infarct , age undetermined Abnormal ECG Confirmed by Yasmen Cortner  MD, Caryn BeeKEVIN ([id number]) on 01/24/2017 1:33:53 PM       Radiology Dg Chest 2 View  Result Date: 01/24/2017 CLINICAL DATA:  Initial evaluation for acute chest pain, history of PE. EXAM: CHEST  2 VIEW COMPARISON:   Prior radiograph and CT from 11/15/2016. FINDINGS: Mild cardiomegaly, stable. Mediastinal silhouette within normal limits. Lungs normally inflated. Mild bibasilar subsegmental atelectasis. No focal infiltrate, pulmonary edema, or pleural effusion. A pneumothorax. Nodular density overlying the mid left lung base favored to reflect a nipple shadow. No acute osseus abnormality. IMPRESSION: Mild bibasilar subsegmental atelectasis. No other active cardiopulmonary disease identified. Electronically Signed   By: Rise MuBenjamin  McClintock M.D.   On: 01/24/2017 13:14   Ct Angio Chest Pe W And/or Wo Contrast  Result Date: 01/24/2017 CLINICAL DATA:  Pleuritic chest pain EXAM: CT ANGIOGRAPHY CHEST WITH CONTRAST TECHNIQUE: Multidetector CT imaging of the chest was performed using the standard protocol during bolus administration of intravenous contrast. Multiplanar CT image reconstructions and MIPs were obtained to evaluate the vascular anatomy. CONTRAST:  100 mL Isovue 370 COMPARISON:  None. FINDINGS: Cardiovascular: Satisfactory opacification of the pulmonary arteries to the segmental level. No evidence of pulmonary embolism. Stable cardiomegaly. No pericardial effusion. The thoracic aorta is normal in caliber. Mediastinum/Nodes: No enlarged mediastinal, hilar, or axillary lymph nodes. Thyroid gland, trachea, and esophagus demonstrate no significant findings. Lungs/Pleura: Mild bibasilar atelectasis. Otherwise no focal consolidation. No pleural effusion or pneumothorax. Upper Abdomen: No acute upper abdominal abnormality. Musculoskeletal: No lytic or sclerotic osseous lesion. Other: Bilateral retroareolar fibroglandular soft tissue as can be seen with gynecomastia. Review of the MIP images confirms the above findings. IMPRESSION: 1. No acute pulmonary embolus. 2. Normal caliber thoracic aorta. 3. No acute cardiopulmonary disease. 4. Stable cardiomegaly. Electronically Signed   By: Elige KoHetal  Patel   On: 01/24/2017 15:26     Procedures Procedures (including critical care time)  Medications Ordered in ED Medications  ketorolac (TORADOL) 30 MG/ML injection 30 mg (30 mg Intravenous Given 01/24/17 1348)  iopamidol (ISOVUE-370) 76 % injection (100 mLs  Contrast Given 01/24/17 1449)     Initial Impression / Assessment and Plan / ED Course  I have reviewed the triage vital signs and the nursing notes.  Pertinent labs & imaging results that were available during my care of the patient were reviewed by me and considered in my medical decision making (see chart for details).    CT angiogram negative for acute pulmonary embolism.  Doubt ACS.  Likely pleurisy.  Patient feels better after IV Toradol.  Discharge home in good condition.  Final Clinical Impressions(s) / ED Diagnoses   Final diagnoses:  Chest pain, unspecified type    New Prescriptions New Prescriptions   No medications on file     Azalia BilisKevin Markon Jares, MD 01/24/17 1546

## 2017-01-28 DIAGNOSIS — Z86711 Personal history of pulmonary embolism: Secondary | ICD-10-CM | POA: Diagnosis not present

## 2017-01-28 DIAGNOSIS — I517 Cardiomegaly: Secondary | ICD-10-CM | POA: Diagnosis not present

## 2017-01-28 DIAGNOSIS — R079 Chest pain, unspecified: Secondary | ICD-10-CM | POA: Diagnosis not present

## 2017-01-29 ENCOUNTER — Institutional Professional Consult (permissible substitution): Payer: Managed Care, Other (non HMO) | Admitting: Pulmonary Disease

## 2017-02-12 ENCOUNTER — Telehealth: Payer: Self-pay | Admitting: Oncology

## 2017-02-12 ENCOUNTER — Encounter: Payer: Self-pay | Admitting: Oncology

## 2017-02-12 NOTE — Telephone Encounter (Signed)
Tc to the pt to schedule a hem appt. Appt scheduled for the pt to see Dr. Clelia CroftShadad on 4/4 at 11am. Aware to arrive 30 minutes early. Demographics verified. Letter mailed.

## 2017-02-13 DIAGNOSIS — R079 Chest pain, unspecified: Secondary | ICD-10-CM | POA: Diagnosis not present

## 2017-03-26 ENCOUNTER — Telehealth: Payer: Self-pay | Admitting: *Deleted

## 2017-03-26 NOTE — Telephone Encounter (Signed)
Called patient to remind him of his new patient appointment with Dr. Clelia Croft tomorrow. Patient verbalized understanding.

## 2017-03-27 ENCOUNTER — Telehealth: Payer: Self-pay | Admitting: Oncology

## 2017-03-27 ENCOUNTER — Ambulatory Visit (HOSPITAL_BASED_OUTPATIENT_CLINIC_OR_DEPARTMENT_OTHER): Payer: 59 | Admitting: Oncology

## 2017-03-27 VITALS — BP 128/79 | HR 63 | Temp 98.5°F | Resp 18 | Ht 70.0 in | Wt 195.3 lb

## 2017-03-27 DIAGNOSIS — Z8489 Family history of other specified conditions: Secondary | ICD-10-CM

## 2017-03-27 DIAGNOSIS — I2699 Other pulmonary embolism without acute cor pulmonale: Secondary | ICD-10-CM

## 2017-03-27 DIAGNOSIS — I82492 Acute embolism and thrombosis of other specified deep vein of left lower extremity: Secondary | ICD-10-CM | POA: Diagnosis not present

## 2017-03-27 NOTE — Telephone Encounter (Signed)
Appointments scheduled per 4.4.18 LOS. Patient given AVS report and calendars with future scheduled appointments. Patient isn't available on 6.6.18. He will be out of town from 6.6.18 to 6.8.18. Patient requested to be scheduled for the following week, scheduled for 6.12.18.

## 2017-03-27 NOTE — Progress Notes (Signed)
Reason for Referral: Pulmonary embolism   HPI: 56 year old gentleman native of [place] currently resides in [place]. He is a rather healthy gentleman without any significant comorbid conditions and not on a regular medications. He presented to his primary care physician in November 2017 after started developing a left lower extremity edema. He was exercising on a stationary bike when he noted that swelling in his leg. He subsequently started developing palpitation and chest pain and was evaluated in the emergency department initially on 11/08/2016. His evaluation was unrevealing but subsequently his chest pain persisted and was evaluated again on 11/15/2016. At that time CT angiogram was done and showed acute pulmonary embolus in the right upper lobe of the lung. He was hospitalized briefly and was discharged on Xarelto starting on 11/16/2016. He continues to have persistent chest pain periodically and was attributed to proceeding and subsequently costochondritis. These symptoms have improved after using nonsteroidal anti-inflammatory. He is currently taking Xarelto without any complications or bleeding episodes. He is currently exercising regularly and completely asymptomatic. He did have a repeat CT scan on 01/24/2017 after presenting with acute chest pain and showed no repeat pulmonary embolism.  He denied any personal history of her previous thrombosis episodes. He denied any clear-cut provoking symptoms although he did have a car ride about 3 hours 2 weeks prior to this episode. He was also dehydrated at that time after midnight of increased drinking. He also reports working heavily in a sitting position for an extended period of time close to 3 or 4 hours leading up to that episode. He denied any trauma, surgery or any other intervention. He does have family history of thrombosis including his father who had a blood clot at the age of 66 but his was provoked. His father health is good at this time  and he is off anticoagulation. He does have a daughter who is pregnant for the second time and did not have any thrombosis episodes.  He denies any headaches, blurry vision, syncope or seizures. He does not report any fevers, chills or sweats. He is not report any cough, wheezing or hemoptysis. He no longer reporting any chest pain, palpitation, orthopnea or leg edema. He does not report any nausea, vomiting or abdominal pain. He does not report any frequency urgency or hesitancy. He does not report any skeletal complaints. Remaining review of systems unremarkable.   Past Medical History:  Diagnosis Date  . History of pulmonary embolus (PE)   :  No past surgical history on file.:   Current Outpatient Prescriptions:  .  Cyanocobalamin (VITAMIN B-12) 1000 MCG SUBL, Place 1,000 mcg under the tongue 3 (three) times a week. , Disp: , Rfl:  .  Rivaroxaban 15 & 20 MG TBPK, Take as directed on package: Start with one  tablet by mouth twice a day with food. On Day 22, switch to one  tablet once a day with food. (Patient taking differently: Take 20 mg by mouth daily. Take as directed on package: Start with one  tablet by mouth twice a day with food. On Day 22, switch to one  tablet once a day with food.), Disp: 51 each, Rfl: 0:  Allergies  Allergen Reactions  . Benzoyl Peroxide Rash and Other (See Comments)    Cream  :  No family history on file.:  Social History   Social History  . Marital status: Married    Spouse name: N/A  . Number of children: N/A  . Years of education: N/A  Occupational History  . Not on file.   Social History Main Topics  . Smoking status: Never Smoker  . Smokeless tobacco: Never Used  . Alcohol use Yes     Comment: occ  . Drug use: No  . Sexual activity: Not on file   Other Topics Concern  . Not on file   Social History Narrative  . No narrative on file  :  Pertinent items are noted in HPI.  Exam: Blood pressure 128/79, pulse 63,  temperature 98.5 F (36.9 C), temperature source Oral, resp. rate 18, height  (1.778 m), weight 195 lb 4.8 oz (88.6 kg), SpO2 98 %.  ECOG 0 General appearance: alert and cooperative appeared without distress. Throat: No oral ulcers or lesions. Neck: no adenopathy Back: negative Resp: clear to auscultation bilaterally Cardio: regular rate and rhythm, S1, S2 normal, no murmur, click, rub or gallop GI: soft, non-tender; bowel sounds normal; no masses,  no organomegaly Extremities: extremities normal, atraumatic. Slight edema noted on the left more than the right. Pulses: 2+ and symmetric Skin: Skin color, texture, turgor normal. No rashes or lesions  CBC    Component Value Date/Time   WBC 5.1 01/24/2017 1228   RBC 4.63 01/24/2017 1228   HGB 15.4 01/24/2017 1228   HCT 43.7 01/24/2017 1228   PLT 141 (L) 01/24/2017 1228   MCV 94.4 01/24/2017 1228   MCH 33.3 01/24/2017 1228   MCHC 35.2 01/24/2017 1228   RDW 12.3 01/24/2017 1228      Chemistry      Component Value Date/Time   NA 140 01/24/2017 1228   K 4.1 01/24/2017 1228   CL 107 01/24/2017 1228   CO2 24 01/24/2017 1228   BUN 13 01/24/2017 1228   CREATININE 1.02 01/24/2017 1228      Component Value Date/Time   CALCIUM 9.5 01/24/2017 1228   ALKPHOS 69 11/15/2016 0144   AST 24 11/15/2016 0144   ALT 15 (L) 11/15/2016 0144   BILITOT 0.9 11/15/2016 0144       Assessment and Plan:   56 year old gentleman with the following issues:  1. 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 He did have a left peroneal vein thrombosis and subsequently pulmonary embolism based on a CT scan of the chest. He is currently on Xarelto since that time.  The natural course and the etiology of this thrombophilia was discussed today with the patient extensively. 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 He does have the family history of thrombosis in his father who had a DVT after a long plane ride in his 63s. Inherited thrombophilia is certainly a possibility in him which increases the risk of deep vein thrombosis with few provoking symptoms like he experienced.  From a management standpoint, I have recommended full dose anticoagulation for at least 6 months. Once he is off anticoagulation, I will obtain a hypercoagulable workup to determine whether he has inherited or acquired thrombophilia. The risks and benefits of obtaining the screening studies were discussed today. The benefit will allow [place] to risk stratify his risk of repeat thrombosis in the future can also have a genetic implication to his daughter. He understands that includes multiple blood tests that we can obtain off anticoagulation to ensure there  is no false positive results with Xarelto.  I do not recommend anticoagulation for lifetime at this time unless he develops a subsequent unprovoked thrombosis episodes.  We also discussed strategies to decrease his risk of future thrombosis episodes. He is already exercising regularly containing adequate hydration and proper stretching when he travels. He does not smoke and does not have any other health factors to attribute to her future risk of thrombosis.  2. Age-appropriate cancer screening: I do not think his recent thrombosis episodes related to occult malignancy. He had 2 imaging studies of the chest and he is up-to-date on his colonoscopy. I recommend continuing age-appropriate cancer screening moving forward.  3. Anticipated dental work: I have no objections of stopping Xarelto temporarily to have his dental work done at that time.  4. Follow-up: Will be in June 2018 discussed the results of his hypercoagulable workup which will be done prior to his visit.

## 2017-05-07 ENCOUNTER — Other Ambulatory Visit (HOSPITAL_BASED_OUTPATIENT_CLINIC_OR_DEPARTMENT_OTHER): Payer: 59

## 2017-05-07 DIAGNOSIS — I2699 Other pulmonary embolism without acute cor pulmonale: Secondary | ICD-10-CM

## 2017-05-08 LAB — PROTEIN S ACTIVITY: Protein S-Functional: 86 % (ref 63–140)

## 2017-05-08 LAB — PROTEIN S, TOTAL: PROTEIN S AG TOTAL: 53 % — AB (ref 60–150)

## 2017-05-08 LAB — LUPUS ANTICOAGULANT PANEL
DRVVT: 38.6 s (ref 0.0–47.0)
PTT-LA: 34.5 s (ref 0.0–51.9)

## 2017-05-08 LAB — PROTEIN C ACTIVITY: PROTEIN C ACTIVITY: 104 % (ref 73–180)

## 2017-05-08 LAB — ANTITHROMBIN III: ANTITHROMBIN ACTIVITY: 115 % (ref 75–135)

## 2017-05-09 LAB — BETA-2-GLYCOPROTEIN I ABS, IGG/M/A
Beta-2 Glyco 1 IgA: <9 GPI IgA units (ref 0–25)
Beta-2 Glycoprotein I Ab, IgG: <9 GPI IgG units (ref 0–20)

## 2017-05-09 LAB — PROTEIN C, TOTAL: Protein C Antigen: 95 % (ref 60–150)

## 2017-05-09 LAB — CARDIOLIPIN ANTIBODIES, IGG, IGM, IGA
Anticardiolipin Ab,IgA,Qn: <9 APL U/mL (ref 0–11)
Anticardiolipin Ab,IgG,Qn: <9 GPL U/mL (ref 0–14)
Anticardiolipin Ab,IgM,Qn: <9 MPL U/mL (ref 0–12)

## 2017-05-13 LAB — FACTOR 5 LEIDEN

## 2017-05-13 LAB — PROTHROMBIN GENE MUTATION

## 2017-06-04 ENCOUNTER — Ambulatory Visit (HOSPITAL_BASED_OUTPATIENT_CLINIC_OR_DEPARTMENT_OTHER): Payer: 59 | Admitting: Oncology

## 2017-06-04 VITALS — BP 162/87 | HR 63 | Temp 98.5°F | Resp 20 | Ht 70.0 in | Wt 193.9 lb

## 2017-06-04 DIAGNOSIS — I2699 Other pulmonary embolism without acute cor pulmonale: Secondary | ICD-10-CM | POA: Diagnosis not present

## 2017-06-04 DIAGNOSIS — I82492 Acute embolism and thrombosis of other specified deep vein of left lower extremity: Secondary | ICD-10-CM

## 2017-06-04 NOTE — Progress Notes (Signed)
Hematology and Oncology Follow Up Visit  Alec MontaneJoseph Sharp [id number] 07/06/1961 56 y.o. 06/04/2017 1:46 PM Koirala, Dibas, MDKoirala, Dibas, MD   Principle Diagnosis: 56 year old gentleman with deep vein thrombosis and pulmonary embolism diagnosed in November 2017. He did have a left peroneal vein thrombosis and subsequently pulmonary embolism based on a CT scan of the chest. Potentially provoked by a long flight.   Prior Therapy: Xarelto which she completed 6 months of anticoagulation and a fourth 2018. His hypercoagulable workup is unrevealing.  Current therapy: Observation and surveillance.  Interim History: Mr. Alec Sharp presents today for a follow-up visit. Since the last visit, he completed 6 months of Xarelto and completed hypercoagulable workup was unremarkable. He is feeling reasonably well without any recurrent thrombosis. He denied any chest pain or difficulty breathing. He denied any swelling or edema. He traveled to [place] without complications.  He denies any headaches, blurry vision, syncope or seizures. He does not report any fevers, chills or sweats. He is not report any cough, wheezing or hemoptysis. He no longer reporting any chest pain, palpitation, orthopnea or leg edema. He does not report any nausea, vomiting or abdominal pain. He does not report any frequency urgency or hesitancy. He does not report any skeletal complaints. Remaining review of systems unremarkable.   Medications: I have reviewed the patient's current medications.  Current Outpatient Prescriptions  Medication Sig Dispense Refill  . Cyanocobalamin (VITAMIN B-12) 1000 MCG SUBL Place 1,000 mcg under the tongue 3 (three) times a week.     . Rivaroxaban 15 & 20 MG TBPK Take as directed on package: Start with one 15mg  tablet by mouth twice a day with food. On Day 22, switch to one 20mg  tablet once a day with food. (Patient taking differently: Take 20 mg by mouth daily. Take as directed on package: Start with one  15mg  tablet by mouth twice a day with food. On Day 22, switch to one 20mg  tablet once a day with food.) 51 each 0   No current facility-administered medications for this visit.      Allergies:  Allergies  Allergen Reactions  . Benzoyl Peroxide Rash and Other (See Comments)    Cream    Past Medical History, Surgical history, Social history, and Family History were reviewed and updated.  Physical Exam: Blood pressure (!) 162/87, pulse 63, temperature 98.5 F (36.9 C), temperature source Oral, resp. rate 20, height 5\' 10"  (1.778 m), weight 193 lb 14.4 oz (88 kg), SpO2 99 %. ECOG: 0 General appearance: alert and cooperative Head: Normocephalic, without obvious abnormality Neck: no adenopathy Lymph nodes: Cervical, supraclavicular, and axillary nodes normal. Heart:regular rate and rhythm, S1, S2 normal, no murmur, click, rub or gallop Lung:chest clear, no wheezing, rales, normal symmetric air entry Abdomin: soft, non-tender, without masses or organomegaly EXT:no erythema, induration, or nodules   Lab Results: Lab Results  Component Value Date   WBC 5.1 01/24/2017   HGB 15.4 01/24/2017   HCT 43.7 01/24/2017   MCV 94.4 01/24/2017   PLT 141 (L) 01/24/2017     Chemistry      Component Value Date/Time   NA 140 01/24/2017 1228   K 4.1 01/24/2017 1228   CL 107 01/24/2017 1228   CO2 24 01/24/2017 1228   BUN 13 01/24/2017 1228   CREATININE 1.02 01/24/2017 1228      Component Value Date/Time   CALCIUM 9.5 01/24/2017 1228   ALKPHOS 69 11/15/2016 0144   AST 24 11/15/2016 0144   ALT 15 (L) 11/15/2016 0144  BILITOT 0.9 11/15/2016 0144       Impression and Plan:  56 year old gentleman with the following issues:  1. 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 He did have a left peroneal vein thrombosis and subsequently pulmonary embolism based on a CT scan of the chest.He completed 6 months of anticoagulation in May 2018.  His hypercoagulable  workup obtained on 05/07/2017 was reviewed today and showed no abnormalities.  Risks and benefits of continuing long-term anticoagulation versus discontinuing anticoagulation were reviewed. He understands given the fact he potentially had an unprovoked blood clot that he is at risk of developing another one. He is also aware of the complications associated with long-term anticoagulation. After discussing the risks and benefits, he opted to continue to be off anticoagulation for the time being. He understands if he develops another blood clot in the future he'll require long-term anticoagulation.  2. Follow-up: I'm happy to see him in the future as needed.   Eli Hose, MD 6/12/20181:46 PM

## 2017-06-07 ENCOUNTER — Emergency Department (HOSPITAL_COMMUNITY): Payer: 59

## 2017-06-07 ENCOUNTER — Emergency Department (HOSPITAL_COMMUNITY)
Admission: EM | Admit: 2017-06-07 | Discharge: 2017-06-07 | Disposition: A | Discharge status: Home / Self Care | Payer: 59 | Attending: Emergency Medicine | Admitting: Emergency Medicine

## 2017-06-07 ENCOUNTER — Other Ambulatory Visit: Payer: Self-pay

## 2017-06-07 ENCOUNTER — Encounter (HOSPITAL_COMMUNITY): Payer: Self-pay | Admitting: *Deleted

## 2017-06-07 DIAGNOSIS — R51 Headache: Secondary | ICD-10-CM | POA: Diagnosis not present

## 2017-06-07 DIAGNOSIS — R519 Headache, unspecified: Secondary | ICD-10-CM

## 2017-06-07 DIAGNOSIS — R110 Nausea: Secondary | ICD-10-CM | POA: Insufficient documentation

## 2017-06-07 DIAGNOSIS — R52 Pain, unspecified: Secondary | ICD-10-CM

## 2017-06-07 DIAGNOSIS — I10 Essential (primary) hypertension: Secondary | ICD-10-CM | POA: Diagnosis not present

## 2017-06-07 LAB — CBC
HCT: 44 % (ref 39.0–52.0)
Hemoglobin: 15.9 g/dL (ref 13.0–17.0)
MCH: 33.8 pg (ref 26.0–34.0)
MCHC: 36.1 g/dL — AB (ref 30.0–36.0)
MCV: 93.4 fL (ref 78.0–100.0)
PLATELETS: 156 10*3/uL (ref 150–400)
RBC: 4.71 MIL/uL (ref 4.22–5.81)
RDW: 12 % (ref 11.5–15.5)
WBC: 7 10*3/uL (ref 4.0–10.5)

## 2017-06-07 LAB — BASIC METABOLIC PANEL
Anion gap: 9 (ref 5–15)
BUN: 12 mg/dL (ref 6–20)
CALCIUM: 9.6 mg/dL (ref 8.9–10.3)
CHLORIDE: 104 mmol/L (ref 101–111)
CO2: 24 mmol/L (ref 22–32)
CREATININE: 0.93 mg/dL (ref 0.61–1.24)
GFR calc non Af Amer: >60 mL/min (ref >60)
GLUCOSE: 110 mg/dL — AB (ref 65–99)
Potassium: 3.5 mmol/L (ref 3.5–5.1)
Sodium: 137 mmol/L (ref 135–145)

## 2017-06-07 LAB — TROPONIN I

## 2017-06-07 MED ORDER — DEXAMETHASONE SODIUM PHOSPHATE 10 MG/ML IJ SOLN
10.0000 mg | Freq: Once | INTRAMUSCULAR | Status: AC
  Administered 2017-06-07: 10 mg via INTRAVENOUS
  Filled 2017-06-07: qty 1

## 2017-06-07 MED ORDER — DIPHENHYDRAMINE HCL 50 MG/ML IJ SOLN
25.0000 mg | Freq: Once | INTRAMUSCULAR | Status: AC
  Administered 2017-06-07: 25 mg via INTRAVENOUS
  Filled 2017-06-07: qty 1

## 2017-06-07 MED ORDER — PROCHLORPERAZINE EDISYLATE 5 MG/ML IJ SOLN
10.0000 mg | Freq: Once | INTRAMUSCULAR | Status: AC
  Administered 2017-06-07: 10 mg via INTRAVENOUS
  Filled 2017-06-07: qty 2

## 2017-06-07 MED ORDER — SODIUM CHLORIDE 0.9 % IV BOLUS (SEPSIS)
1000.0000 mL | Freq: Once | INTRAVENOUS | Status: AC
  Administered 2017-06-07: 1000 mL via INTRAVENOUS

## 2017-06-07 MED ORDER — AMLODIPINE BESYLATE 5 MG PO TABS: 5.0000 mg | ORAL_TABLET | Freq: Every day | ORAL | 0 refills | Status: AC

## 2017-06-07 MED ORDER — AMLODIPINE BESYLATE 5 MG PO TABS
5.0000 mg | ORAL_TABLET | Freq: Every day | ORAL | Status: DC
  Administered 2017-06-07: 5 mg via ORAL
  Filled 2017-06-07: qty 1

## 2017-06-07 NOTE — ED Notes (Addendum)
Pt has had persistent headache for the past week worse today, not hx of HTN, has had it for the past week, father had brain aneurysm at age 56, pt worried about the same, pain started in the back of his head and now on the L side, worst headache of his life

## 2017-06-07 NOTE — ED Provider Notes (Signed)
[facility]-EMERGENCY DEPT Provider Note   CSN: [id number] Arrival date & time: 06/07/17  0034 By signing my name below, I, Alec Sharp, attest that this documentation has been prepared under the direction and in the presence of Alec Sharp, Alec Schmiesing, MD . Electronically Signed: Levon HedgerElizabeth Sharp, Scribe. 06/07/2017. 2:37 AM.   History   Chief Complaint Chief Complaint  Patient presents with  . Headache    HPI Alec Sharp is a 56 y.o. male with a history of PE who presents to the Emergency Department complaining of gradually worsening, throbbing left-sided headache onset one week ago. Pt states headaches are not typical for him and he has never had a headache this severe. He expresses that his blood pressure has been elevated this week which is also abnormal for him. He reports associated nausea and photophobia. He has taken OTC pain medication with no relief of symptoms. The patient is currently on no regular medications. [place] flew from [place] last week. He denies the use of anti-coagulants. No hx HTN or migraines. Pt denies any vomiting, fever, speech difficulty, facial asymmetry, speech difficulty, visual disturbances, phonophobia, numbness of weakness.   The history is provided by the patient. No language interpreter was used.    Past Medical History:  Diagnosis Date  . History of pulmonary embolus (PE)     Patient Active Problem List   Diagnosis Date Noted  . Chest pain 11/15/2016  . Pulmonary embolus (HCC) 11/15/2016  . Bradycardia 11/15/2016    History reviewed. No pertinent surgical history.    Home Medications    Prior to Admission medications   Medication Sig Start Date End Date Taking? Authorizing Provider  ibuprofen (ADVIL,MOTRIN) 200 MG tablet Take 400 mg by mouth every 6 (six) hours as needed for headache.   Yes [provider]  amLODipine (NORVASC) 5 MG tablet Take 1 tablet (5 mg total) by mouth daily. 06/07/17   Alec Sharp, Alec Vandevoort, MD    Family History No  family history on file.  Social History Social History  Substance Use Topics  . Smoking status: Never Smoker  . Smokeless tobacco: Never Used  . Alcohol use Yes     Comment: occ     Allergies   Benzoyl peroxide   Review of Systems Review of Systems  Constitutional: Negative for fever.  HENT: Negative for sore throat.   Eyes: Positive for photophobia. Negative for visual disturbance.  Respiratory: Negative for shortness of breath.   Cardiovascular: Negative for chest pain.  Gastrointestinal: Positive for nausea. Negative for abdominal pain and vomiting.  Genitourinary: Negative for difficulty urinating.  Musculoskeletal: Negative for back pain and neck stiffness.  Skin: Negative for rash.  Allergic/Immunologic: Negative for immunocompromised state.  Neurological: Positive for numbness and headaches. Negative for syncope, facial asymmetry, speech difficulty and weakness.  Hematological: Does not bruise/bleed easily.    Physical Exam Updated Vital Signs BP 132/81 (BP Location: Right Arm)   Pulse 76   Temp 98.2 F (36.8 C)   Resp 18   Ht 5\' 10"  (1.778 m)   Wt 86.6 kg (190 lb 14.4 oz)   SpO2 96%   BMI 27.39 kg/m   Physical Exam  Constitutional: He is oriented to person, place, and time. He appears well-developed and well-nourished. No distress.  HENT:  Head: Normocephalic and atraumatic.  Eyes: Conjunctivae and EOM are normal. Pupils are equal, round, and reactive to light.  Neck: Normal range of motion.  Cardiovascular: Normal rate, regular rhythm, normal heart sounds and intact distal pulses.  Exam  reveals no gallop and no friction rub.   No murmur heard. Pulmonary/Chest: Effort normal and breath sounds normal. No respiratory distress. He has no wheezes. He has no rales.  Abdominal: Soft. He exhibits no distension. There is no tenderness. There is no guarding.  Musculoskeletal: Normal range of motion. He exhibits no edema.  Neurological: He is alert and oriented  to person, place, and time. He has normal strength. No cranial nerve deficit or sensory deficit. Coordination normal. GCS eye subscore is 4. GCS verbal subscore is 5. GCS motor subscore is 6.  No pronator drift. Normal sensation.   Skin: Skin is warm and dry. He is not diaphoretic.  Psychiatric: He has a normal mood and affect. Judgment normal.  Nursing note and vitals reviewed.   ED Treatments / Results  DIAGNOSTIC STUDIES:  Oxygen Saturation is 99% on RA, normal by my interpretation.    COORDINATION OF CARE:  2:31 AM Discussed treatment plan with pt at bedside and pt agreed to plan.   Labs (all labs ordered are listed, but only abnormal results are displayed) Labs Reviewed  BASIC METABOLIC PANEL - Abnormal; Notable for the following:       Result Value   Glucose, Bld 110 (*)    All other components within normal limits  CBC - Abnormal; Notable for the following:    MCHC 36.1 (*)    All other components within normal limits  TROPONIN I    EKG  EKG Interpretation  Date/Time:  Friday June 07 2017 00:48:38 EDT Ventricular Rate:  71 PR Interval:  162 QRS Duration: 92 QT Interval:  396 QTC Calculation: 430 R Axis:   92 Text Interpretation:  Normal sinus rhythm with sinus arrhythmia Rightward axis Borderline ECG No significant change since last tracing Confirmed by Alec Monday ([id number]) on 06/07/2017 7:04:25 AM       Radiology No results found.  Procedures Procedures (including critical care time)  Medications Ordered in ED Medications  sodium chloride 0.9 % bolus 1,000 mL (0 mLs Intravenous Stopped 06/07/17 0517)  prochlorperazine (COMPAZINE) injection 10 mg (10 mg Intravenous Given 06/07/17 0255)  diphenhydrAMINE (BENADRYL) injection 25 mg (25 mg Intravenous Given 06/07/17 0258)  dexamethasone (DECADRON) injection 10 mg (10 mg Intravenous Given 06/07/17 0259)     Initial Impression / Assessment and Plan / ED Course  I have reviewed the triage vital signs and the  nursing notes.  Pertinent labs & imaging results that were available during my care of the patient were reviewed by me and considered in my medical decision making (see chart for details).     56yo male presents with concern for headache.  CT head negative. GIven hx of thromboembolism now off anticoagulation and recent travel leading to HA, performed MRV/MRI brain to evaluate for dural venous sinus thrombosis which was negative. HA improved after headache cocktail. Blood pressure elevated, unclear if secondary to pain or if BP contributing to headache.  Given amlodipine rx, discussed recommendation for PCP follow up. Patient discharged in stable condition with understanding of reasons to return.   Final Clinical Impressions(s) / ED Diagnoses   Final diagnoses:  Headache  Pain  Acute nonintractable headache, unspecified headache type  Hypertension, unspecified type    New Prescriptions Discharge Medication List as of 06/07/2017  7:25 AM    START taking these medications   Details  amLODipine (NORVASC) 5 MG tablet Take 1 tablet (5 mg total) by mouth daily., Starting Fri 06/07/2017, Print  I personally performed the services described in this documentation, which was scribed in my presence. The recorded information has been reviewed and is accurate.    Alec Monday, MD 06/09/17 [phone]

## 2017-06-07 NOTE — ED Triage Notes (Addendum)
The pt is c/o a headache for one week  Never had a HEADACHE IN THE PAST  IBU NOT HELPING  LT SIDED HEADACHE.  No bp meds  Hx of pe   Neuro exam negative at present  His bp has been high also  No hx of hypertension  On no bp meds

## 2017-06-07 NOTE — ED Notes (Signed)
Patient transported to MRI 

## 2017-06-07 NOTE — ED Notes (Signed)
Pt returns from MRI and stastessz headache much improved. Alert and oriented x 4 MAEW resp even and  Non labored.

## 2017-06-10 DIAGNOSIS — I10 Essential (primary) hypertension: Secondary | ICD-10-CM | POA: Diagnosis not present

## 2017-06-10 DIAGNOSIS — M542 Cervicalgia: Secondary | ICD-10-CM | POA: Diagnosis not present

## 2017-06-20 NOTE — Progress Notes (Signed)
Alec Sharp was seen today in neurologic consultation at the request of Koirala, Dibas, MD.  The consultation is for the evaluation of headache.   The records that were made available to me were reviewed.  The patient is a 56 y.o. year old male with a history of headache.  The patient reports that this headache began about June 8 when coming home from [place].  Initially started as allergies with sneezing.  Headache was in the bilateral occiput.  Allergic symptoms resolved but headaches did not.  He was seen in the ED for this on June 15.  He was found to have an elevated blood pressure in the emergency room.  He was given a prescription for amlodipine along with a headache cocktail, consisting of Benadryl, Decadron and Compazine which did seem to help the headache.  However, the headache came back about 8 hours later.  He followed up with his primary care physician.  He continued to have some headache and was given a prescription for prednisone.  The location of the headache is in the occiput and radiating to the L side of the head and radiates to the eye.  He has allodynia around the eye - "skin feels tight."  Finds that if he presses in the occiput and taps he can actually "create" the headache.  He has been taking aleve since Saturday and it has helped some.  There is no photophobia.  There is no phonophobia.  The patient reports that there is occasional mild associated nausea and no emesis.  Trouble sleeping even though the prednisone has worn off.  The patient reports no autonomic features such as rhinorrhea, conjunctival erythema, or tearing of the eyes.    Has a hx of PE in November, 2017, now off of anticoagulants.  No hx of significant headache   Neuroimaging has  previously been performed.  It is available for my review today.  MRI of the brain was done in the emergency room on 06/07/2017.  Had the opportunity to review this.  There were a mild number of T2 hyperintensities,  particularly at the gray-white junction.  MRA was unremarkable.   ALLERGIES:   Allergies  Allergen Reactions  . Benzoyl Peroxide Rash and Other (See Comments)    Cream    CURRENT MEDICATIONS:  Current Outpatient Prescriptions on File Prior to Visit  Medication Sig Dispense Refill  . amLODipine (NORVASC) 5 MG tablet Take 1 tablet (5 mg total) by mouth daily. 30 tablet 0   No current facility-administered medications on file prior to visit.     PAST MEDICAL HISTORY:   Past Medical History:  Diagnosis Date  . History of pulmonary embolus (PE)     PAST SURGICAL HISTORY:   Past Surgical History:  Procedure Laterality Date  . HAND SURGERY    . HERNIA REPAIR      SOCIAL HISTORY:   Social History   Social History  . Marital status: Married    Spouse name: N/A  . Number of children: N/A  . Years of education: N/A   Occupational History  . Not on file.   Social History Main Topics  . Smoking status: Never Smoker  . Smokeless tobacco: Never Used  . Alcohol use Yes     Comment: 2 BEERS A WEEK AT MOST  . Drug use: No  . Sexual activity: Not on file   Other Topics Concern  . Not on file   Social History Narrative  . No  narrative on file    FAMILY HISTORY:   Family Status  Relation Status  . Mother Alive  . Father Alive  . Sister Alive  . Brother Alive  . Daughter Alive    ROS:  A complete 10 system review of systems was obtained and was unremarkable apart from what is mentioned above.  PHYSICAL EXAMINATION:    VITALS:   Vitals:   06/21/17 0945  BP: 122/80  Pulse: 68  SpO2: 97%  Weight: 191 lb (86.6 kg)  Height: 5\' 10"  (1.778 m)    GEN:  Appears stated age and in NAD. HEENT:  Normocephalic, atraumatic. The mucous membranes are moist. The superficial temporal arteries are without ropiness or tenderness. Cardiovascular: Regular rate and rhythm. Lungs: Clear to auscultation bilaterally. Neck: There are no carotid bruits noted bilaterally. MS:  Pt  has very tender L occipital notch.  When pushed upon, pt reports that this exactly reproduces his headache.  NEUROLOGICAL: Orientation:  Pt is alert and oriented x 3.  Fund of knowledge is appropriate.  Recent and remote memory intact.  Attention and concentration normal.  Able to repeat and name. Cranial nerves: There is good facial symmetry. The pupils are equal round and reactive to light bilaterally. Funduscopic exam reveals clear disc margins bilaterally. Extraocular muscles are intact and visual fields are full to confrontational testing. Speech is fluent and clear. Soft palate rises symmetrically and there is no tongue deviation. Hearing is intact to conversational tone. Tone: Tone is good throughout. Sensation: Sensation is intact to light touch and pinprick throughout (facial, extremities, trunk). Vibration is intact at the bilateral big toe. There is no extinction with double simultaneous stimulation. There is no sensory dermatomal level identified. Coordination:  The patient has no difficulty with RAM's or FNF bilaterally. Motor: Strength is 5/5 in the bilateral upper and lower extremities. Shoulder shrug is equal and symmetric.  There is no pronator drift.  There are no fasciculations noted. DTR's: Deep tendon reflexes are 3/4 at the bilateral biceps, triceps, brachioradialis, patella and achilles.  Plantar responses are downgoing bilaterally. Gait and Station: The patient is able to ambulate without difficulty. The patient is able to heel toe walk without any difficulty. The patient is able to ambulate in a tandem fashion. The patient is able to stand in the Romberg position.   IMPRESSIONS/PLAN:  1.  Headache.  This is most characteristic of occipital neuralgia on the L.  He is very tender over L occipital notch with clear reproduction of head pain when touching that area.   Pt has tried and failed other agents, including oral steroids.  Has tried norvasc.  Talked about r/b/se.  He would  like to proceed with occipital nerve injection.  If doesn't help or doesn't help fully, will repeat in 1-2 weeks.  He will let me know.  Was done today and described on separate procedure note.    CC:  Koirala, Dibas, MD

## 2017-06-21 ENCOUNTER — Encounter: Payer: Self-pay | Admitting: Neurology

## 2017-06-21 ENCOUNTER — Ambulatory Visit (INDEPENDENT_AMBULATORY_CARE_PROVIDER_SITE_OTHER): Payer: 59 | Admitting: Neurology

## 2017-06-21 VITALS — BP 122/80 | HR 68 | Ht 70.0 in | Wt 191.0 lb

## 2017-06-21 DIAGNOSIS — M5481 Occipital neuralgia: Secondary | ICD-10-CM

## 2017-06-21 MED ORDER — BUPIVACAINE HCL 0.25 % IJ SOLN
1.5000 mL | Freq: Once | INTRAMUSCULAR | Status: AC
  Administered 2017-06-21: 1.5 mL

## 2017-06-21 MED ORDER — METHYLPREDNISOLONE ACETATE 40 MG/ML IJ SUSP
40.0000 mg | Freq: Once | INTRAMUSCULAR | Status: AC
  Administered 2017-06-21: 40 mg via INTRAMUSCULAR

## 2017-06-21 NOTE — Procedures (Signed)
The patient expresses desire today for occipital nerve block on the L due to occipital neuralgia.  After discussing r/b/se and alternative tx's, the patient decided to proceed with the occipital nerve block.  Informed consent was obtained The area was cleansed with alcohol prior to the procedure.  Approximately 40 mg of depomedrol (1cc) and 1.5 cc of 0.25% marcaine were injected into the left occipital notch using a 27 gauge needle.  Needle drawback prior to injection did not yield any flashback of blood.  The patient tolerated the procedure well and there were no complications.

## 2017-07-08 NOTE — Progress Notes (Signed)
Alec Sharp was seen today in neurologic consultation at the request of Koirala, Dibas, MD.  The consultation is for the evaluation of headache.   The records that were made available to me were reviewed.  The patient is a 56 y.o. year old male with a history of headache.  The patient reports that this headache began about June 8 when coming home from [place].  Initially started as allergies with sneezing.  Headache was in the bilateral occiput.  Allergic symptoms resolved but headaches did not.  He was seen in the ED for this on June 15.  He was found to have an elevated blood pressure in the emergency room.  He was given a prescription for amlodipine along with a headache cocktail, consisting of Benadryl, Decadron and Compazine which did seem to help the headache.  However, the headache came back about 8 hours later.  He followed up with his primary care physician.  He continued to have some headache and was given a prescription for prednisone.  The location of the headache is in the occiput and radiating to the L side of the head and radiates to the eye.  He has allodynia around the eye - "skin feels tight."  Finds that if he presses in the occiput and taps he can actually "create" the headache.  He has been taking aleve since Saturday and it has helped some.  There is no photophobia.  There is no phonophobia.  The patient reports that there is occasional mild associated nausea and no emesis.  Trouble sleeping even though the prednisone has worn off.  The patient reports no autonomic features such as rhinorrhea, conjunctival erythema, or tearing of the eyes.    Has a hx of PE in November, 2017, now off of anticoagulants.  No hx of significant headache   Neuroimaging has  previously been performed.  It is available for my review today.  MRI of the brain was done in the emergency room on 06/07/2017.  Had the opportunity to review this.  There were a mild number of T2 hyperintensities,  particularly at the gray-white junction.  MRA was unremarkable.  07/09/17 update:  Patient seen today in follow-up.  He had an occipital nerve block on the left on 06/21/2017.  Patient reports that overall he is much better and had a great weekend in terms of no headaches but when he called for the appointment he had several flare ups in which he had head pain that would occur from the L occiput that would last 6 hours.  He is noting that he has trifocals and they have a narrow range of focus and when he tips the head to focus (extends the neck), he is creating the head pain.  He is having muscle spasm and was given flexeril but hasn't taken it recently.  He has taken flexeril in the past and it doesn't make him sleepy but he tried that in the past and is not sure it is helpful.  The muscle is sore at the base of the skull, even on the right side.  When he touches the right side of the head, the pain will radiate over to the L occiput and create the head pain   ALLERGIES:   Allergies  Allergen Reactions  . Benzoyl Peroxide Rash and Other (See Comments)    Cream    CURRENT MEDICATIONS:  Current Outpatient Prescriptions on File Prior to Visit  Medication Sig Dispense Refill  . amLODipine (  NORVASC) 5 MG tablet Take 1 tablet (5 mg total) by mouth daily. 30 tablet 0  . naproxen sodium (ANAPROX) 220 MG tablet Take 220 mg by mouth 2 (two) times daily with a meal.    . vitamin B-12 (CYANOCOBALAMIN) 1000 MCG tablet Take 1,000 mcg by mouth daily.     No current facility-administered medications on file prior to visit.     PAST MEDICAL HISTORY:   Past Medical History:  Diagnosis Date  . History of pulmonary embolus (PE)     PAST SURGICAL HISTORY:   Past Surgical History:  Procedure Laterality Date  . HAND SURGERY    . HERNIA REPAIR      SOCIAL HISTORY:   Social History   Social History  . Marital status: Married    Spouse name: N/A  . Number of children: N/A  . Years of education: N/A     Occupational History  . Not on file.   Social History Main Topics  . Smoking status: Never Smoker  . Smokeless tobacco: Never Used  . Alcohol use Yes     Comment: 2 BEERS A WEEK AT MOST  . Drug use: No  . Sexual activity: Not on file   Other Topics Concern  . Not on file   Social History Narrative  . No narrative on file    FAMILY HISTORY:   Family Status  Relation Status  . Mother Alive  . Father Alive  . Sister Alive  . Brother Alive  . Daughter Alive    ROS:  A complete 10 system review of systems was obtained and was unremarkable apart from what is mentioned above.  PHYSICAL EXAMINATION:    VITALS:   Vitals:   07/09/17 1426  BP: 128/76  Pulse: 63  SpO2: 97%  Weight: 190 lb (86.2 kg)  Height: 5\' 10"  (1.778 m)    GEN:  Appears stated age and in NAD. HEENT:  Normocephalic, atraumatic. The mucous membranes are moist. The superficial temporal arteries are without ropiness or tenderness. Cardiovascular: Regular rate and rhythm. Lungs: Clear to auscultation bilaterally. Neck: There are no carotid bruits noted bilaterally. MS:  Pt has very tender L occipital notch.  When pushed upon, pt reports that this exactly reproduces his headache.  It is not as severe as last time.    He has some tenderness across the entire occiput but it causes the headache on the L head  NEUROLOGICAL: Orientation:  Pt is alert and oriented x 3.   Cranial nerves: There is good facial symmetry.  Speech is fluent and clear. Soft palate rises symmetrically and there is no tongue deviation. Hearing is intact to conversational tone. Tone: Tone is good throughout. Sensation: Sensation is intact to light touch touch throughout Coordination:  The patient has no difficulty with RAM's or FNF bilaterally. Motor: Strength is 5/5 in the bilateral upper and lower extremities. Shoulder shrug is equal and symmetric.  There is no pronator drift.  There are no fasciculations noted. Gait and Station: The  patient is able to ambulate without difficulty. .   IMPRESSIONS/PLAN:  1.  Occipital neuralgia  -Start zanaflex 4mg  - 1 tablet at night (You can take this up to twice per day if it doesn't make you sleepy)  -RX for mobic 7.5 mg - 1 tablet daily as needed. Risks, benefits, side effects and alternative therapies were discussed.  The opportunity to ask questions was given and they were answered to the best of my ability.  The patient  expressed understanding and willingness to follow the outlined treatment protocols.  -pt told to call me if it doesn't help and we can do another injection and/or physical therapy.  We can also consider a cervical spine MRI if needed. Expressed understanding.    CC:  Koirala, Dibas, MD

## 2017-07-09 ENCOUNTER — Ambulatory Visit (INDEPENDENT_AMBULATORY_CARE_PROVIDER_SITE_OTHER): Payer: 59 | Admitting: Neurology

## 2017-07-09 ENCOUNTER — Encounter: Payer: Self-pay | Admitting: Neurology

## 2017-07-09 VITALS — BP 128/76 | HR 63 | Ht 70.0 in | Wt 190.0 lb

## 2017-07-09 DIAGNOSIS — M5481 Occipital neuralgia: Secondary | ICD-10-CM

## 2017-07-09 MED ORDER — TIZANIDINE HCL 4 MG PO TABS: 4.0000 mg | ORAL_TABLET | Freq: Two times a day (BID) | ORAL | 0 refills | Status: DC | PRN

## 2017-07-09 MED ORDER — MELOXICAM 7.5 MG PO TABS: 7.5000 mg | ORAL_TABLET | Freq: Every day | ORAL | 0 refills | Status: DC

## 2017-07-09 NOTE — Patient Instructions (Signed)
Start zanaflex 4mg  - 1 tablet at night (You can take this up to twice per day if it doesn't make you sleepy)  There is also a RX for mobic 7.5 mg - 1 tablet daily as needed  Call me if it doesn't help and we can do another injection and/or physical therapy.  We can also consider a cervical spine MRI if needed

## 2017-08-05 ENCOUNTER — Other Ambulatory Visit: Payer: Self-pay | Admitting: Neurology

## 2017-10-07 ENCOUNTER — Encounter: Payer: Self-pay | Admitting: Neurology

## 2017-10-13 DIAGNOSIS — Z23 Encounter for immunization: Secondary | ICD-10-CM | POA: Diagnosis not present

## 2017-11-18 DIAGNOSIS — Z0000 Encounter for general adult medical examination without abnormal findings: Secondary | ICD-10-CM | POA: Diagnosis not present

## 2017-11-18 DIAGNOSIS — Z1159 Encounter for screening for other viral diseases: Secondary | ICD-10-CM | POA: Diagnosis not present

## 2017-11-18 DIAGNOSIS — Z13220 Encounter for screening for lipoid disorders: Secondary | ICD-10-CM | POA: Diagnosis not present

## 2018-05-15 DIAGNOSIS — H35373 Puckering of macula, bilateral: Secondary | ICD-10-CM | POA: Diagnosis not present

## 2018-05-15 DIAGNOSIS — H43813 Vitreous degeneration, bilateral: Secondary | ICD-10-CM | POA: Diagnosis not present

## 2018-05-15 DIAGNOSIS — H43393 Other vitreous opacities, bilateral: Secondary | ICD-10-CM | POA: Diagnosis not present

## 2018-06-16 ENCOUNTER — Encounter: Payer: Self-pay | Admitting: Neurology

## 2018-06-16 DIAGNOSIS — I10 Essential (primary) hypertension: Secondary | ICD-10-CM | POA: Diagnosis not present

## 2018-06-19 DIAGNOSIS — I10 Essential (primary) hypertension: Secondary | ICD-10-CM | POA: Diagnosis not present

## 2018-06-23 DIAGNOSIS — I10 Essential (primary) hypertension: Secondary | ICD-10-CM | POA: Diagnosis not present

## 2018-07-02 ENCOUNTER — Encounter: Payer: Self-pay | Admitting: Neurology

## 2018-07-02 DIAGNOSIS — I10 Essential (primary) hypertension: Secondary | ICD-10-CM | POA: Diagnosis not present

## 2018-07-03 NOTE — Progress Notes (Signed)
Alec Sharp was seen today in neurologic consultation at the request of Koirala, Dibas, MD.  The consultation is for the evaluation of headache.   The records that were made available to me were reviewed.  The patient is a 57 y.o. year old male with a history of headache.  The patient reports that this headache began about June 8 when coming home from [place].  Initially started as allergies with sneezing.  Headache was in the bilateral occiput.  Allergic symptoms resolved but headaches did not.  He was seen in the ED for this on June 15.  He was found to have an elevated blood pressure in the emergency room.  He was given a prescription for amlodipine along with a headache cocktail, consisting of Benadryl, Decadron and Compazine which did seem to help the headache.  However, the headache came back about 8 hours later.  He followed up with his primary care physician.  He continued to have some headache and was given a prescription for prednisone.  The location of the headache is in the occiput and radiating to the L side of the head and radiates to the eye.  He has allodynia around the eye - "skin feels tight."  Finds that if he presses in the occiput and taps he can actually "create" the headache.  He has been taking aleve since Saturday and it has helped some.  There is no photophobia.  There is no phonophobia.  The patient reports that there is occasional mild associated nausea and no emesis.  Trouble sleeping even though the prednisone has worn off.  The patient reports no autonomic features such as rhinorrhea, conjunctival erythema, or tearing of the eyes.    Has a hx of PE in November, 2017, now off of anticoagulants.  No hx of significant headache   Neuroimaging has  previously been performed.  It is available for my review today.  MRI of the brain was done in the emergency room on 06/07/2017.  Had the opportunity to review this.  There were a mild number of T2 hyperintensities,  particularly at the gray-white junction.  MRA was unremarkable.  07/09/17 update:  Patient seen today in follow-up.  He had an occipital nerve block on the left on 06/21/2017.  Patient reports that overall he is much better and had a great weekend in terms of no headaches but when he called for the appointment he had several flare ups in which he had head pain that would occur from the L occiput that would last 6 hours.  He is noting that he has trifocals and they have a narrow range of focus and when he tips the head to focus (extends the neck), he is creating the head pain.  He is having muscle spasm and was given flexeril but hasn't taken it recently.  He has taken flexeril in the past and it doesn't make him sleepy but he tried that in the past and is not sure it is helpful.  The muscle is sore at the base of the skull, even on the right side.  When he touches the right side of the head, the pain will radiate over to the L occiput and create the head pain  07/07/18 update: Patient seen today in follow-up for occipital neuralgia.  I have not seen him in 1 year.  Patient reports that he has been pain-free for nearly 1 year until very recently.  The pain is not nearly as bad as  it was last year.  4-5 weeks ago it started to come back but not nearly like last year and it has been better over the last week.  Patient reports that he has been having episodes of high blood pressure and was told that it could be related to the pain associated with occipital neuralgia.  He was tried on hctz without relief of the BP spikes.  He was then retried on norvasc.  He then restarted mobic and zanaflex and head pain is much more mild - "I have to press on the area to be able to feel pain."  Admits that he thinks that posture at work is part of the issue and really has been trying to work on that.  He reports BP is better over the last week and is unsure if it is because he is now on norvasc or if because head pain is better.  Max  BPs were in the 160's.    ALLERGIES:   Allergies  Allergen Reactions  . Benzoyl Peroxide Rash and Other (See Comments)    Cream    CURRENT MEDICATIONS:  Current Outpatient Medications on File Prior to Visit  Medication Sig Dispense Refill  . amLODipine (NORVASC) 5 MG tablet Take 1 tablet (5 mg total) by mouth daily. 30 tablet 0  . meloxicam (MOBIC) 7.5 MG tablet TAKE 1 TABLET BY MOUTH EVERY DAY 30 tablet 2  . tiZANidine (ZANAFLEX) 4 MG tablet TAKE 1 TABLET (4 MG TOTAL) BY MOUTH 2 (TWO) TIMES DAILY AS NEEDED FOR MUSCLE SPASMS. 60 tablet 2   No current facility-administered medications on file prior to visit.     PAST MEDICAL HISTORY:   Past Medical History:  Diagnosis Date  . History of pulmonary embolus (PE)     PAST SURGICAL HISTORY:   Past Surgical History:  Procedure Laterality Date  . HAND SURGERY    . HERNIA REPAIR      SOCIAL HISTORY:   Social History   Socioeconomic History  . Marital status: Married    Spouse name: Not on file  . Number of children: Not on file  . Years of education: Not on file  . Highest education level: Not on file  Occupational History  . Not on file  Social Needs  . Financial resource strain: Not on file  . Food insecurity:    Worry: Not on file    Inability: Not on file  . Transportation needs:    Medical: Not on file    Non-medical: Not on file  Tobacco Use  . Smoking status: Never Smoker  . Smokeless tobacco: Never Used  Substance and Sexual Activity  . Alcohol use: Yes    Comment: 2 BEERS A WEEK AT MOST  . Drug use: No  . Sexual activity: Not on file  Lifestyle  . Physical activity:    Days per week: Not on file    Minutes per session: Not on file  . Stress: Not on file  Relationships  . Social connections:    Talks on phone: Not on file    Gets together: Not on file    Attends religious service: Not on file    Active member of club or organization: Not on file    Attends meetings of clubs or organizations: Not on  file    Relationship status: Not on file  . Intimate partner violence:    Fear of current or ex partner: Not on file    Emotionally abused: Not on  file    Physically abused: Not on file    Forced sexual activity: Not on file  Other Topics Concern  . Not on file  Social History Narrative  . Not on file    FAMILY HISTORY:   Family Status  Relation Name Status  . Mother  Alive  . Father  Alive  . Sister  Alive  . Brother  Alive  . Daughter  Alive    ROS:  Review of Systems  Constitutional: Negative.   HENT: Negative.   Eyes: Positive for blurred vision (working with eye doctor on new glasses; hx of retinal tear).  Respiratory: Negative.   Cardiovascular: Negative.   Gastrointestinal: Positive for abdominal pain (seems to come on with BP spikes).  Genitourinary: Negative.   Musculoskeletal: Positive for neck pain.  Skin: Negative.   Neurological: Positive for headaches.     PHYSICAL EXAMINATION:    VITALS:   Vitals:   07/07/18 0912  BP: (!) 148/84  Pulse: 64  SpO2: 97%  Weight: 193 lb (87.5 kg)  Height: 5\' 10"  (1.778 m)    GEN:  Appears stated age and in NAD. HEENT:  Normocephalic, atraumatic. The mucous membranes are moist.  No superficial temporal artery ropiness or tenderness. Cardiovascular: Regular rate and rhythm. Lungs: Clear to auscultation bilaterally. Neck: No bruits. MS:  Pt has no significant pain over occipital notch - just mild tender on the L side.     NEUROLOGICAL:  Orientation:  The patient is alert and oriented x 3.   Cranial nerves: There is good facial symmetry. The pupils are equal round and reactive to light bilaterally. Fundoscopic exam reveals clear disc margins bilaterally. Extraocular muscles are intact and visual fields are full to confrontational testing. Speech is fluent and clear. Soft palate rises symmetrically and there is no tongue deviation. Hearing is intact to conversational tone. Tone: Tone is good throughout. Sensation:  Sensation is intact to light touch x 4 Coordination:  The patient has no difficulty with RAM's or FNF bilaterally. Motor: Strength is 5/5 in the bilateral upper and lower extremities.  Shoulder shrug is equal bilaterally.  There is no pronator drift.  There are no fasciculations noted. DTR's: Deep tendon reflexes are 2/4 at the bilateral biceps, triceps, brachioradialis, patella and achilles.  Plantar responses are downgoing bilaterally. Gait and Station: The patient is able to ambulate without difficulty.     IMPRESSIONS/PLAN:  1.  Occipital neuralgia  -I do not think that this is related to his high blood pressure at all.  His BP has not been high enough to generate headache.  I think his blood pressure is improved because he is now on Norvasc, not because his head pain is so much better.  -discussed office ergonomics, which likely are contributing to his pain.  -we will do MRI cervical spine to make sure that we are not missing cervical degenerative changes causing neck pain and generating occipital neuralgia.  -discussed massage therapy.  He is noticing that zanaflex is helping.  He asks about how long he can take mobic.  told him can take daily for 14-21 days and then not take daily.  States that he just had lab work done and was told kidney function was normal.  -pt told to call me if pain returns and we always can do an injection.  Physical therapy could also be of value and we discussed this today.  CC:  Koirala, Dibas, MD

## 2018-07-07 ENCOUNTER — Encounter: Payer: Self-pay | Admitting: Neurology

## 2018-07-07 ENCOUNTER — Ambulatory Visit (INDEPENDENT_AMBULATORY_CARE_PROVIDER_SITE_OTHER): Payer: 59 | Admitting: Neurology

## 2018-07-07 ENCOUNTER — Ambulatory Visit: Payer: 59 | Admitting: Neurology

## 2018-07-07 VITALS — BP 148/84 | HR 64 | Ht 70.0 in | Wt 193.0 lb

## 2018-07-07 DIAGNOSIS — M5481 Occipital neuralgia: Secondary | ICD-10-CM | POA: Diagnosis not present

## 2018-07-07 DIAGNOSIS — I10 Essential (primary) hypertension: Secondary | ICD-10-CM | POA: Diagnosis not present

## 2018-07-07 DIAGNOSIS — M542 Cervicalgia: Secondary | ICD-10-CM

## 2018-07-07 NOTE — Patient Instructions (Signed)
1. We have sent a referral to [facility] for your MRI and they will call you directly to schedule your appt. They are located at [address] If you need to contact them directly please call [phone].   

## 2018-07-14 ENCOUNTER — Encounter: Payer: Self-pay | Admitting: Neurology

## 2018-07-15 ENCOUNTER — Encounter: Payer: Self-pay | Admitting: Neurology

## 2018-07-15 ENCOUNTER — Ambulatory Visit
Admission: RE | Admit: 2018-07-15 | Discharge: 2018-07-15 | Disposition: A | Discharge status: Home / Self Care | Payer: 59 | Source: Ambulatory Visit | Attending: Neurology | Admitting: Neurology

## 2018-07-15 DIAGNOSIS — M5481 Occipital neuralgia: Secondary | ICD-10-CM

## 2018-07-15 DIAGNOSIS — M542 Cervicalgia: Secondary | ICD-10-CM

## 2018-07-16 ENCOUNTER — Telehealth: Payer: Self-pay | Admitting: Neurology

## 2018-07-16 ENCOUNTER — Ambulatory Visit
Admission: RE | Admit: 2018-07-16 | Discharge: 2018-07-16 | Disposition: A | Discharge status: Home / Self Care | Payer: 59 | Source: Ambulatory Visit | Attending: Neurology | Admitting: Neurology

## 2018-07-16 DIAGNOSIS — M4802 Spinal stenosis, cervical region: Secondary | ICD-10-CM | POA: Diagnosis not present

## 2018-07-16 DIAGNOSIS — M50223 Other cervical disc displacement at C6-C7 level: Secondary | ICD-10-CM | POA: Diagnosis not present

## 2018-07-16 MED ORDER — DIAZEPAM 5 MG PO TABS: ORAL_TABLET | ORAL | 0 refills | Status: AC

## 2018-07-16 NOTE — Telephone Encounter (Signed)
Patient called and is needing his Prescription sent to [facility] [address]. [facility]. Thanks

## 2018-07-16 NOTE — Telephone Encounter (Signed)
Mychart message sent to patient.

## 2018-07-16 NOTE — Telephone Encounter (Signed)
-----   Message from Van ClinesKaren M Aquino, MD sent at 07/16/2018  2:04 PM EDT ----- Pls let him know the MRI cervical spine did show degenerative/arthritis changes more on the left side, which is likely causing his issues. Dr. Arbutus Leasat had discussed with him how PT can help, would recommend PT. Thanks

## 2018-07-16 NOTE — Telephone Encounter (Signed)
RX sent to [facility] [address]. Called to cancel RX at [facility] and patient has already picked this up. RX at [facility] [address]. Cancelled.

## 2018-07-17 ENCOUNTER — Other Ambulatory Visit: Payer: Self-pay | Admitting: Neurology

## 2018-07-17 ENCOUNTER — Encounter: Payer: Self-pay | Admitting: Neurology

## 2018-07-17 NOTE — Telephone Encounter (Signed)
Notes reviewed. OK to refill.

## 2018-07-17 NOTE — Telephone Encounter (Signed)
Please advise on refill.

## 2018-07-18 ENCOUNTER — Telehealth: Payer: Self-pay | Admitting: Neurology

## 2018-07-18 NOTE — Telephone Encounter (Signed)
Received note from [facility] that patient is scheduled 07/23/18.

## 2018-07-18 NOTE — Telephone Encounter (Signed)
I have already informed patient of MR results.

## 2018-07-18 NOTE — Telephone Encounter (Signed)
Patient sent a message through my chart to check MRI results. Thanks

## 2018-07-21 ENCOUNTER — Encounter: Payer: Self-pay | Admitting: Neurology

## 2018-07-30 DIAGNOSIS — G542 Cervical root disorders, not elsewhere classified: Secondary | ICD-10-CM | POA: Diagnosis not present

## 2018-07-30 DIAGNOSIS — I10 Essential (primary) hypertension: Secondary | ICD-10-CM | POA: Diagnosis not present

## 2018-08-05 DIAGNOSIS — M542 Cervicalgia: Secondary | ICD-10-CM | POA: Diagnosis not present

## 2018-08-08 DIAGNOSIS — M542 Cervicalgia: Secondary | ICD-10-CM | POA: Diagnosis not present

## 2018-08-12 ENCOUNTER — Other Ambulatory Visit: Payer: Self-pay | Admitting: Neurology

## 2018-08-14 DIAGNOSIS — M542 Cervicalgia: Secondary | ICD-10-CM | POA: Diagnosis not present

## 2018-08-21 DIAGNOSIS — M542 Cervicalgia: Secondary | ICD-10-CM | POA: Diagnosis not present

## 2018-09-04 ENCOUNTER — Encounter

## 2018-09-04 ENCOUNTER — Ambulatory Visit: Payer: 59 | Admitting: Neurology

## 2018-09-10 IMAGING — MR MR CERVICAL SPINE W/O CM
4 of 5 series · 25 of 48 positions shown · non-contrast
Comparison: None.

CLINICAL DATA: History of pulmonary embolism.  Left-sided neck pain

EXAM:
MRI CERVICAL SPINE WITHOUT CONTRAST
TECHNIQUE: Multiplanar, multisequence MR imaging of the cervical spine was
performed. No intravenous contrast was administered.

[Series 4: T2 post-contrast · sagittal · 3.3mm · 0.37mm/px · 8 of 15 slices shown]
[im 1/15]
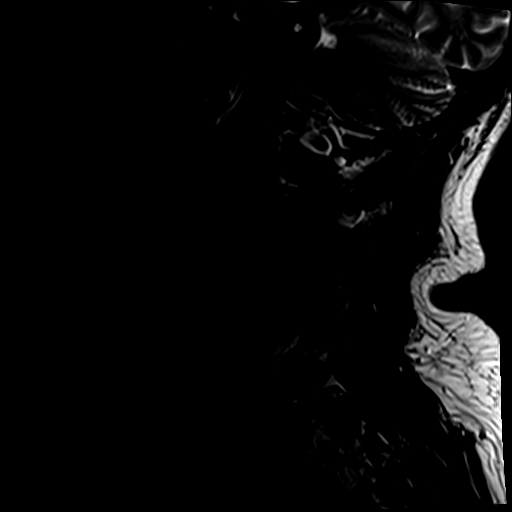
[im 3/15]
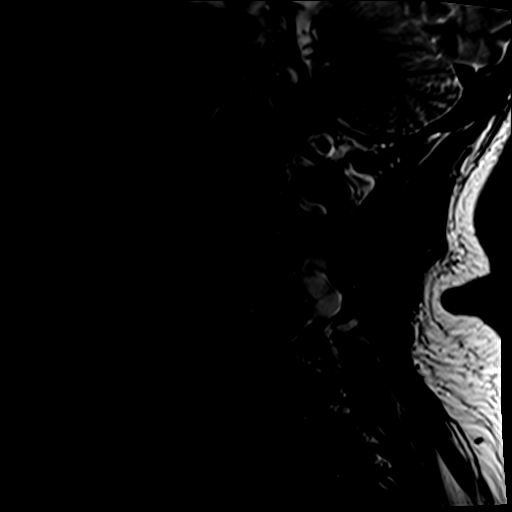
[im 5/15]
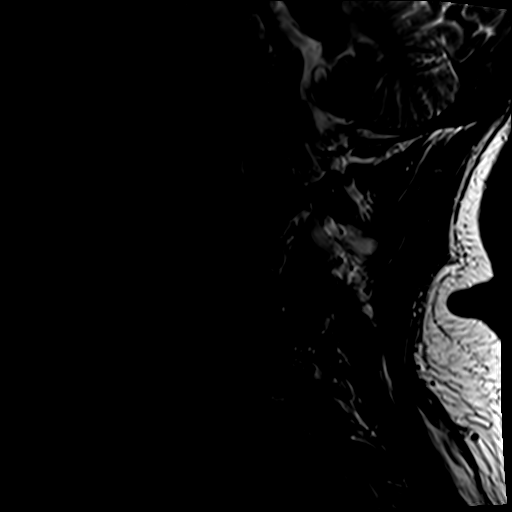
[im 7/15]
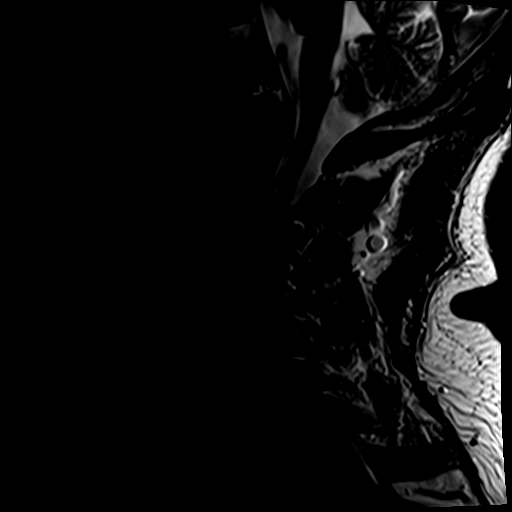
[im 9/15]
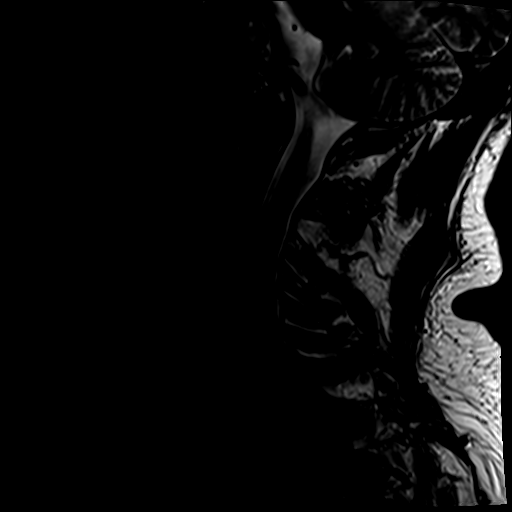
[im 11/15]
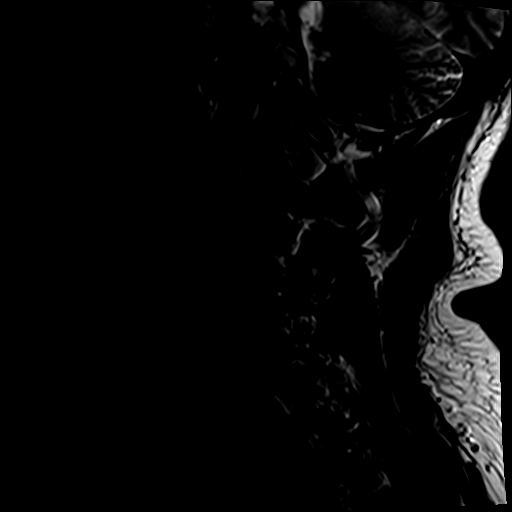
[im 13/15]
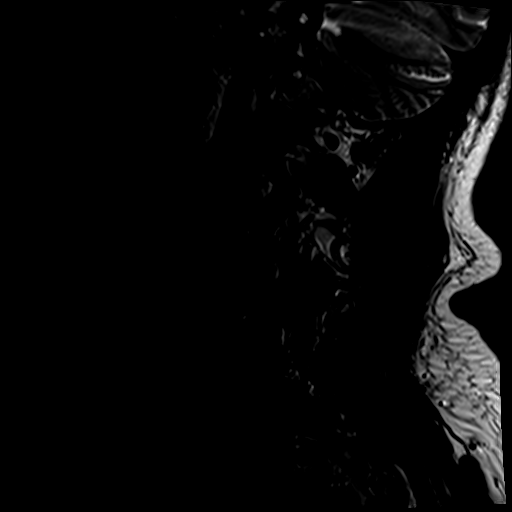
[im 15/15]
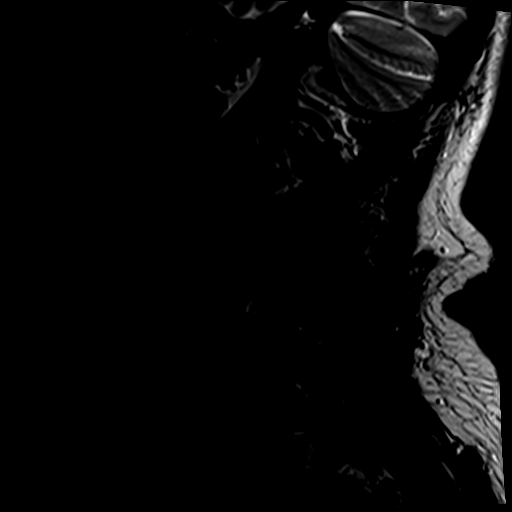

[Series 5: T1 · sagittal · 3.3mm · 0.37mm/px · 5 of 15 slices shown]
[im 1/15]
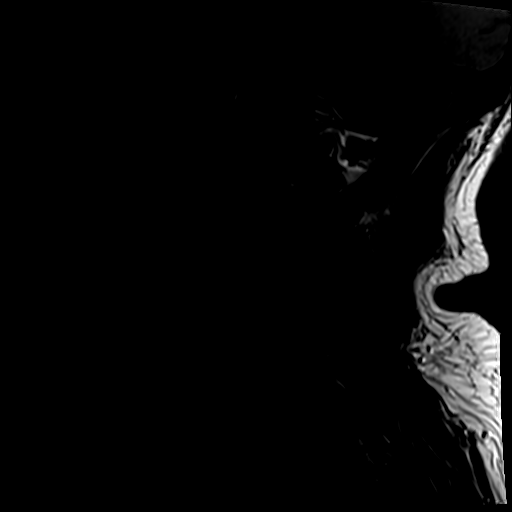
[im 3/15]
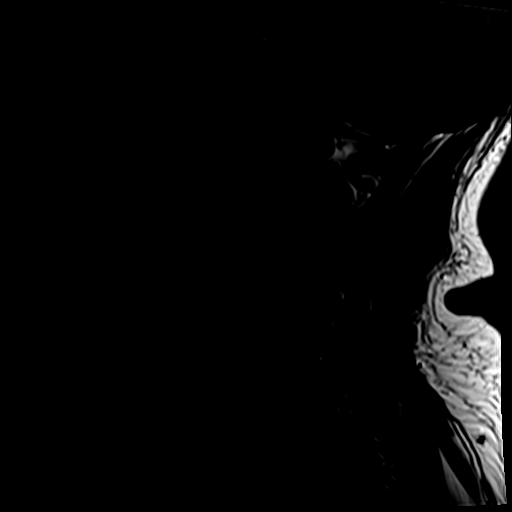
[im 5/15]
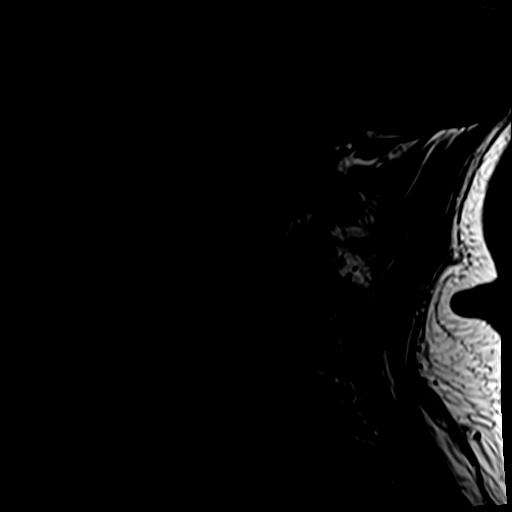
[im 8/15]
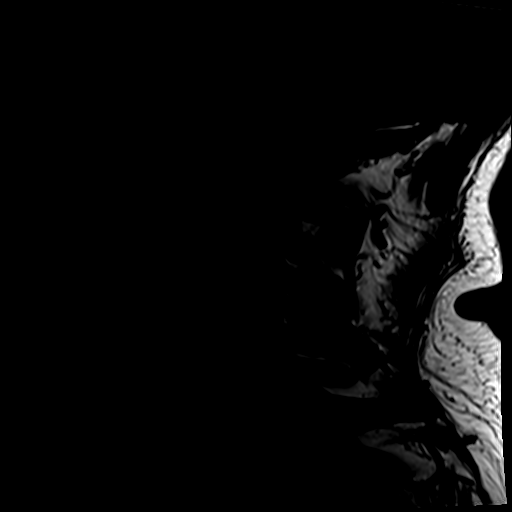
[im 12/15]
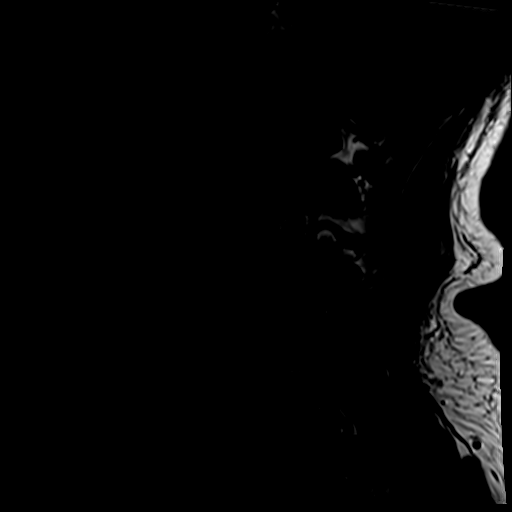

[Series 6: tir sag · sagittal · 3.3mm · 0.37mm/px · 3 of 15 slices shown]
[im 3/15]
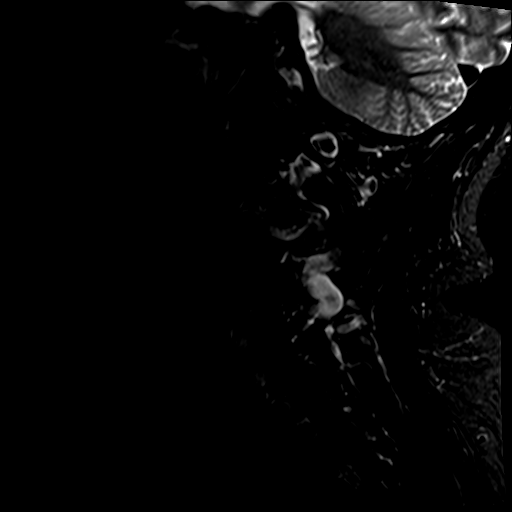
[im 8/15]
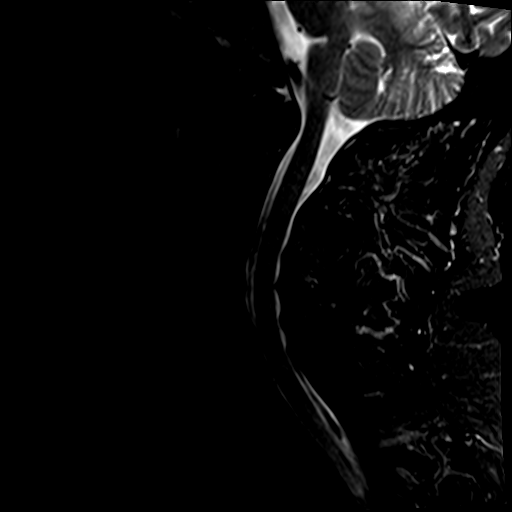
[im 12/15]
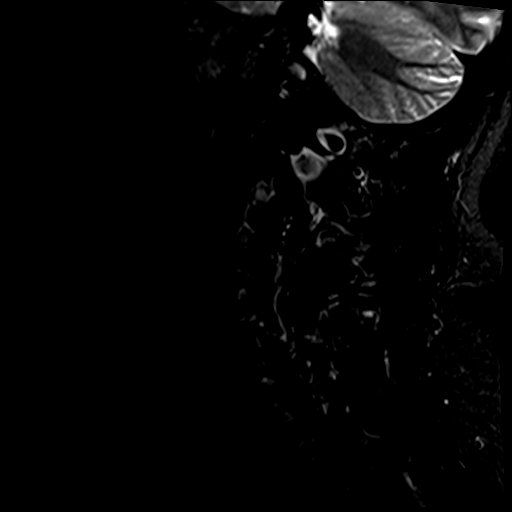

[Series 8: T2 · axial · 3.0mm · 0.70mm/px · z∈[-67,+25]mm · 9 of 26 slices shown]
[im 1/26]
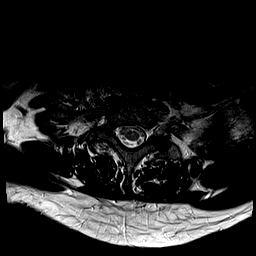
[im 5/26]
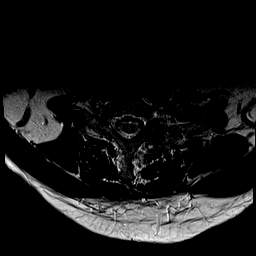
[im 9/26]
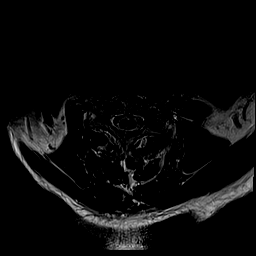
[im 11/26]
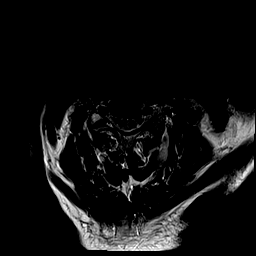
[im 13/26]
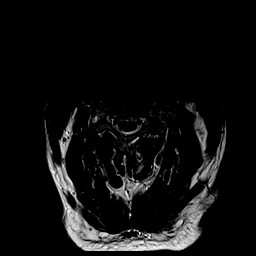
[im 15/26]
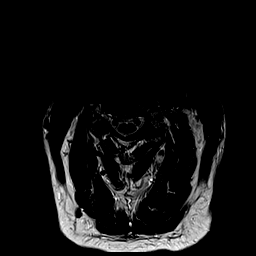
[im 17/26]
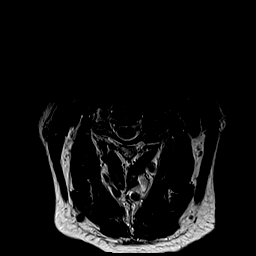
[im 21/26]
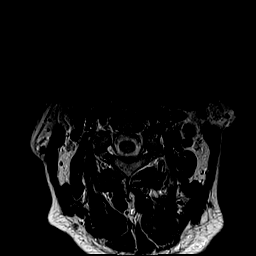
[im 26/26]
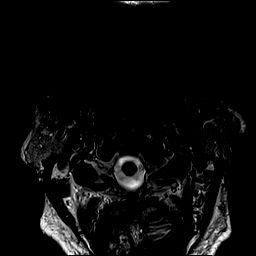

[25 of 48 positions shown; findings below may reference images not displayed]

FINDINGS: Alignment: Exaggerated cervical lordosis.  No listhesis

Vertebrae: No fracture, evidence of discitis, or bone lesion.

Cord: Normal signal and morphology.

Posterior Fossa, vertebral arteries, paraspinal tissues: Negative

Disc levels:

C2-3: Unremarkable.

C3-4: Advanced left foraminal impingement from uncovertebral and
facet spurring.

C4-5: Mild left foraminal narrowing from facet hypertrophy. Mild
disc narrowing.

C5-6: Small left uncovertebral spur with mild to moderate left
foraminal narrowing. Mild left facet spurring

C6-7: Disc narrowing and bulging with asymmetric left uncovertebral
spurring. Mild to moderate left foraminal narrowing. No cord
impingement

C7-T1:Unremarkable.
IMPRESSION: 1. Uncovertebral and/or facet spurring causes left foraminal
narrowing that is advanced at C3-4 and mild-to-moderate at C5-6 and
C6-7.
2. Diffusely patent spinal canal.

## 2019-01-30 DIAGNOSIS — Z13220 Encounter for screening for lipoid disorders: Secondary | ICD-10-CM | POA: Diagnosis not present

## 2019-01-30 DIAGNOSIS — Z0000 Encounter for general adult medical examination without abnormal findings: Secondary | ICD-10-CM | POA: Diagnosis not present

## 2019-01-30 DIAGNOSIS — Z23 Encounter for immunization: Secondary | ICD-10-CM | POA: Diagnosis not present

## 2019-03-31 DIAGNOSIS — Z23 Encounter for immunization: Secondary | ICD-10-CM | POA: Diagnosis not present

## 2019-04-27 ENCOUNTER — Telehealth: Payer: Self-pay | Admitting: Neurology

## 2019-04-27 NOTE — Telephone Encounter (Signed)
This is likely beyond my scope as I don't deal with bursitis or shoulder pain at all.  Pt need to start with PCP for evaluation.

## 2019-04-27 NOTE — Telephone Encounter (Signed)
Please advise 

## 2019-04-27 NOTE — Telephone Encounter (Signed)
Notified pt --to call PCP for evaluation

## 2019-04-27 NOTE — Telephone Encounter (Signed)
I have developed a problem with my right shoulder, going on 6 weeks now with no improvement. I have a limited range of motion with a sharp, searing pain that radiates down my arm when I move my arm over my head or when reaching behind my back. There is no pain unless moving arm to limit of range, so it's manageable for now. Possible bursitis? I took some leftover Meloxicam from a prior injury, but had no improvement over the course of a week. Please advise if I can see the doc for evaluation.     Patient sent this message to [place] about hi shoulder pain please advise if we can do a e visit

## 2019-04-30 DIAGNOSIS — M25511 Pain in right shoulder: Secondary | ICD-10-CM | POA: Diagnosis not present

## 2024-11-06 ENCOUNTER — Other Ambulatory Visit: Payer: Self-pay | Admitting: Urology

## 2024-11-06 DIAGNOSIS — R3912 Poor urinary stream: Secondary | ICD-10-CM

## 2024-11-06 DIAGNOSIS — N401 Enlarged prostate with lower urinary tract symptoms: Secondary | ICD-10-CM

## 2024-11-06 DIAGNOSIS — R972 Elevated prostate specific antigen [PSA]: Secondary | ICD-10-CM

## 2024-11-09 ENCOUNTER — Encounter: Payer: Self-pay | Admitting: Urology

## 2024-11-23 ENCOUNTER — Ambulatory Visit
Admission: RE | Admit: 2024-11-23 | Discharge: 2024-11-23 | Disposition: A | Source: Ambulatory Visit | Attending: Urology | Admitting: Urology

## 2024-11-23 DIAGNOSIS — R3912 Poor urinary stream: Secondary | ICD-10-CM

## 2024-11-23 DIAGNOSIS — N401 Enlarged prostate with lower urinary tract symptoms: Secondary | ICD-10-CM

## 2024-11-23 DIAGNOSIS — R972 Elevated prostate specific antigen [PSA]: Secondary | ICD-10-CM

## 2024-11-23 MED ORDER — GADOPICLENOL 0.5 MMOL/ML IV SOLN
10.0000 mL | Freq: Once | INTRAVENOUS | Status: AC | PRN
  Administered 2024-11-23: 10 mL via INTRAVENOUS
